# Patient Record
Sex: Female | Born: 2000 | Race: Black or African American | Hispanic: No | Marital: Single | Smoking: Never smoker
Health system: Southern US, Community
[De-identification: ages and names within clinical notes are randomized; demographics above are authoritative.]

## PROBLEM LIST (undated history)

## (undated) DIAGNOSIS — K219 Gastro-esophageal reflux disease without esophagitis: Secondary | ICD-10-CM

## (undated) HISTORY — PX: FRACTURE SURGERY: SHX138

## (undated) HISTORY — PX: APPENDECTOMY: SHX54

---
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
## 2020-01-18 ENCOUNTER — Encounter (HOSPITAL_COMMUNITY): Payer: Self-pay

## 2020-01-18 ENCOUNTER — Other Ambulatory Visit: Payer: Self-pay

## 2020-01-18 ENCOUNTER — Ambulatory Visit (HOSPITAL_COMMUNITY)
Admission: EM | Admit: 2020-01-18 | Discharge: 2020-01-18 | Disposition: A | Discharge status: Home / Self Care | Payer: Medicaid Other | Attending: Emergency Medicine | Admitting: Emergency Medicine

## 2020-01-18 DIAGNOSIS — K219 Gastro-esophageal reflux disease without esophagitis: Secondary | ICD-10-CM | POA: Diagnosis present

## 2020-01-18 DIAGNOSIS — J029 Acute pharyngitis, unspecified: Secondary | ICD-10-CM | POA: Diagnosis not present

## 2020-01-18 DIAGNOSIS — J028 Acute pharyngitis due to other specified organisms: Secondary | ICD-10-CM | POA: Diagnosis present

## 2020-01-18 LAB — POCT RAPID STREP A, ED / UC: Streptococcus, Group A Screen (Direct): NEGATIVE

## 2020-01-18 MED ORDER — OMEPRAZOLE 20 MG PO CPDR: 20.0000 mg | DELAYED_RELEASE_CAPSULE | Freq: Two times a day (BID) | ORAL | 0 refills | Status: DC

## 2020-01-18 NOTE — ED Provider Notes (Signed)
[facility]    CSN: [id number] Arrival date & time: 01/18/20  8299      History   Chief Complaint Chief Complaint  Patient presents with  . Gastroesophageal Reflux  . Sore Throat    HPI Carmen Jackson is a 19 y.o. female.   19 yr old AA femalepresents to UC with Cc of sore throat and acid reflux, hx of acid reflux, no meds tried, "eat whatever I want".   The history is provided by the patient. No language interpreter was used.  Sore Throat This is a new problem. The current episode started yesterday. The problem occurs constantly. The problem has not changed since onset.Pertinent negatives include no chest pain, no abdominal pain, no headaches and no shortness of breath. Nothing aggravates the symptoms. Nothing relieves the symptoms. She has tried nothing for the symptoms.    History reviewed. No pertinent past medical history.  Patient Active Problem List   Diagnosis Date Noted  . Pharyngitis 01/18/2020  . Gastroesophageal reflux disease 01/18/2020    Past Surgical History:  Procedure Laterality Date  . ANTERIOR CRUCIATE LIGAMENT REPAIR Left 2018  . APPENDECTOMY    . FRACTURE SURGERY      OB History   No obstetric history on file.      Home Medications    Prior to Admission medications   Medication Sig Start Date End Date Taking? Authorizing Provider  fluconazole (DIFLUCAN) 150 MG tablet One tablet now. Repeat in 3 days if still symptomatic 04/05/19   Elson Areas, PA-C  MICROGESTIN 1-20 MG-MCG tablet Take 1 tablet by mouth daily. 09/23/18   [provider]  Multiple Vitamins-Minerals (ADULT GUMMY PO) Take 1 tablet by mouth daily.    [provider]  omeprazole (PRILOSEC) 20 MG capsule Take 1 capsule (20 mg total) by mouth 2 (two) times daily before a meal for 7 days. 01/18/20 01/25/20  Kamesha Herne, Para March, NP  phenazopyridine (PYRIDIUM) 200 MG tablet Take 1 tablet (200 mg total) by mouth 3 (three) times daily as needed for pain.  12/03/18   Garlon Hatchet, PA-C    Family History Family History  Problem Relation Age of Onset  . Healthy Mother     Social History Social History   Tobacco Use  . Smoking status: Never Smoker  . Smokeless tobacco: Never Used  Vaping Use  . Vaping Use: Never used  Substance Use Topics  . Alcohol use: Not Currently  . Drug use: Never     Allergies   Patient has no known allergies.   Review of Systems Review of Systems  Constitutional: Negative for chills and fever.  HENT: Positive for congestion and sore throat. Negative for ear pain and rhinorrhea.   Eyes: Negative.   Respiratory: Negative for cough and shortness of breath.   Cardiovascular: Negative for chest pain.  Gastrointestinal: Negative for abdominal pain, nausea and vomiting.  Endocrine: Negative.   Genitourinary: Negative for dysuria.  Musculoskeletal: Negative for myalgias.  Skin: Negative for rash.  Allergic/Immunologic: Negative.   Neurological: Negative for headaches.  Hematological: Negative.   Psychiatric/Behavioral: Negative.   All other systems reviewed and are negative.    Physical Exam Triage Vital Signs ED Triage Vitals  Enc Vitals Group     BP 01/18/20 0820 134/71     Pulse Rate 01/18/20 0820 95     Resp 01/18/20 0820 18     Temp 01/18/20 0820 99.5 F (37.5 C)     Temp Source 01/18/20 0820 Oral  SpO2 01/18/20 0820 98 %     Weight --      Height --      Head Circumference --      Peak Flow --      Pain Score 01/18/20 0819 10     Pain Loc --      Pain Edu? --      Excl. in GC? --    No data found.  Updated Vital Signs BP 134/71   Pulse 95   Temp 99.5 F (37.5 C) (Oral)   Resp 18   LMP 12/30/2019   SpO2 98%   Visual Acuity Right Eye Distance:   Left Eye Distance:   Bilateral Distance:    Right Eye Near:   Left Eye Near:    Bilateral Near:     Physical Exam Vitals and nursing note reviewed.  Constitutional:      General: She is not in acute distress.     Appearance: She is well-developed.  HENT:     Head: Normocephalic.     Mouth/Throat:     Pharynx: Posterior oropharyngeal erythema present. No uvula swelling.  Eyes:     General: Lids are normal.     Conjunctiva/sclera: Conjunctivae normal.     Pupils: Pupils are equal, round, and reactive to light.  Neck:     Trachea: No tracheal deviation.  Cardiovascular:     Rate and Rhythm: Normal rate and regular rhythm.     Pulses: Normal pulses.     Heart sounds: Normal heart sounds. No murmur heard.   Pulmonary:     Effort: Pulmonary effort is normal.     Breath sounds: Normal breath sounds.  Abdominal:     General: Abdomen is flat. Bowel sounds are normal.     Palpations: Abdomen is soft.     Tenderness: There is no abdominal tenderness.  Musculoskeletal:        General: Normal range of motion.     Cervical back: Normal range of motion.  Lymphadenopathy:     Cervical: No cervical adenopathy.  Skin:    General: Skin is warm and dry.     Findings: No rash.  Neurological:     Mental Status: She is alert and oriented to person, place, and time.     GCS: GCS eye subscore is 4. GCS verbal subscore is 5. GCS motor subscore is 6.  Psychiatric:        Attention and Perception: Attention normal.        Mood and Affect: Mood normal.        Speech: Speech normal.        Behavior: Behavior normal. Behavior is cooperative.      UC Treatments / Results  Labs (all labs ordered are listed, but only abnormal results are displayed) Labs Reviewed  CULTURE, GROUP A STREP [facility])  POCT RAPID STREP A, ED / UC    EKG   Radiology No results found.  Procedures Procedures (including critical care time)  Medications Ordered in UC Medications - No data to display  Initial Impression / Assessment and Plan / UC Course  I have reviewed the triage vital signs and the nursing notes.  Pertinent labs & imaging results that were available during my care of the patient were reviewed by me and  considered in my medical decision making (see chart for details).   DDX: strep, viral illness, GERD  Final Clinical Impressions(s) / UC Diagnoses   Final diagnoses:  Pharyngitis due to  other organism  Gastroesophageal reflux disease, unspecified whether esophagitis present     Discharge Instructions     Rest.push fluids. May alternate tylenol, ibuprofen as label directed. Your strep test was negative. Please avoid spicy greasy fried foods, alcohol, smoking ,etc as it makes symptoms worse. May use OTC chloraseptic throat lozenges as directed.     ED Prescriptions    Medication Sig Dispense Auth. Provider   omeprazole (PRILOSEC) 20 MG capsule Take 1 capsule (20 mg total) by mouth 2 (two) times daily before a meal for 7 days. 14 capsule Lelah Rennaker, Para March, NP     PDMP not reviewed this encounter.   Clancy Gourd, NP 01/18/20 1004

## 2020-01-18 NOTE — Discharge Instructions (Signed)
Rest.push fluids. May alternate tylenol, ibuprofen as label directed. Your strep test was negative. Please avoid spicy greasy fried foods, alcohol, smoking ,etc as it makes symptoms worse. May use OTC chloraseptic throat lozenges as directed.

## 2020-01-18 NOTE — ED Triage Notes (Signed)
Patient woke up Monday morning with a sore throat. She thinks it may be reflux related. She has not taken any medications to treat her sore throat.

## 2020-01-19 ENCOUNTER — Telehealth (HOSPITAL_COMMUNITY): Payer: Self-pay | Admitting: Emergency Medicine

## 2020-01-19 LAB — CULTURE, GROUP A STREP (THRC): Special Requests: NORMAL

## 2020-01-19 MED ORDER — PENICILLIN V POTASSIUM 500 MG PO TABS: 500.0000 mg | ORAL_TABLET | Freq: Two times a day (BID) | ORAL | 0 refills | Status: AC

## 2020-05-03 ENCOUNTER — Encounter (HOSPITAL_BASED_OUTPATIENT_CLINIC_OR_DEPARTMENT_OTHER): Payer: Self-pay | Admitting: Orthopaedic Surgery

## 2020-05-03 ENCOUNTER — Other Ambulatory Visit: Payer: Self-pay

## 2020-05-05 ENCOUNTER — Encounter (HOSPITAL_BASED_OUTPATIENT_CLINIC_OR_DEPARTMENT_OTHER)
Admission: RE | Admit: 2020-05-05 | Discharge: 2020-05-05 | Disposition: A | Discharge status: Home / Self Care | Payer: Medicaid Other | Source: Ambulatory Visit | Attending: Orthopaedic Surgery | Admitting: Orthopaedic Surgery

## 2020-05-05 DIAGNOSIS — Z01812 Encounter for preprocedural laboratory examination: Secondary | ICD-10-CM | POA: Insufficient documentation

## 2020-05-05 LAB — POCT PREGNANCY, URINE: Preg Test, Ur: NEGATIVE

## 2020-05-05 NOTE — Progress Notes (Signed)

## 2020-05-08 ENCOUNTER — Other Ambulatory Visit (HOSPITAL_COMMUNITY)
Admission: RE | Admit: 2020-05-08 | Discharge: 2020-05-08 | Disposition: A | Discharge status: Home / Self Care | Payer: Medicaid Other | Source: Ambulatory Visit | Attending: Orthopaedic Surgery | Admitting: Orthopaedic Surgery

## 2020-05-08 DIAGNOSIS — U071 COVID-19: Secondary | ICD-10-CM | POA: Insufficient documentation

## 2020-05-08 DIAGNOSIS — Z01812 Encounter for preprocedural laboratory examination: Secondary | ICD-10-CM | POA: Insufficient documentation

## 2020-05-09 ENCOUNTER — Telehealth (HOSPITAL_COMMUNITY): Payer: Self-pay

## 2020-05-09 LAB — SARS CORONAVIRUS 2 (TAT 6-24 HRS): SARS Coronavirus 2: POSITIVE — AB

## 2020-05-09 NOTE — Progress Notes (Signed)
Notified Sherry at Dr. Austin Miles office of patient's positive covid test.

## 2020-05-09 NOTE — Telephone Encounter (Signed)
Contacted Dr. Osa Craver, Surgeon - Left detailed message on voicemail advising to contact office regarding patient's COVID19 lab results.   Contacted surgeon's office - spoke to Ashley/Scheduler - advised patient's COVID19 lab results were positive; surgery will need to be pushed out 10 days unless surgeon deems surgery is urgent/emergent. Morrie Sheldon confirmed she would send surgeon a message regarding results. MBM,CMA

## 2020-05-10 ENCOUNTER — Telehealth: Payer: Self-pay

## 2020-05-10 NOTE — Telephone Encounter (Signed)
Called to discuss with patient about COVID-19 symptoms and the use of one of the available treatments for those with mild to moderate Covid symptoms and at a high risk of hospitalization.  Pt appears to qualify for outpatient treatment due to co-morbid conditions and/or a member of an at-risk group in accordance with the FDA Emergency Use Authorization.    Symptom onset: No symptoms Vaccinated: No Booster? No Immunocompromised? No Qualifiers: None  Pt. Has no symptoms.  Carmen Jackson

## 2020-05-17 ENCOUNTER — Ambulatory Visit (HOSPITAL_COMMUNITY)
Admission: EM | Admit: 2020-05-17 | Discharge: 2020-05-17 | Disposition: A | Discharge status: Home / Self Care | Payer: Medicaid Other | Attending: Medical Oncology | Admitting: Medical Oncology

## 2020-05-17 ENCOUNTER — Encounter (HOSPITAL_COMMUNITY): Payer: Self-pay | Admitting: Emergency Medicine

## 2020-05-17 ENCOUNTER — Other Ambulatory Visit: Payer: Self-pay

## 2020-05-17 DIAGNOSIS — N3001 Acute cystitis with hematuria: Secondary | ICD-10-CM | POA: Diagnosis not present

## 2020-05-17 LAB — POCT URINALYSIS DIPSTICK, ED / UC
Bilirubin Urine: NEGATIVE
Glucose, UA: 100 mg/dL — AB
Ketones, ur: NEGATIVE mg/dL
Nitrite: POSITIVE — AB
Protein, ur: 100 mg/dL — AB
Specific Gravity, Urine: 1.025 (ref 1.005–1.030)
Urobilinogen, UA: 1 mg/dL (ref 0.0–1.0)
pH: 6 (ref 5.0–8.0)

## 2020-05-17 LAB — POC URINE PREG, ED: Preg Test, Ur: NEGATIVE

## 2020-05-17 MED ORDER — SULFAMETHOXAZOLE-TRIMETHOPRIM 800-160 MG PO TABS: 1.0000 | ORAL_TABLET | Freq: Two times a day (BID) | ORAL | 0 refills | Status: AC

## 2020-05-17 NOTE — ED Provider Notes (Signed)
[facility]    CSN: [id number] Arrival date & time: 05/17/20  1954      History   Chief Complaint Chief Complaint  Patient presents with  . Dysuria  . Urinary Urgency  . Abdominal Pain    HPI Carmen Jackson is a 20 y.o. female.   HPI   Dysuria: Patient reports about for the past 3 days she has had dysuria, urinary urgency and suprapubic heaviness.  Similar to past UTIs.  She has tried cranberry supplement as well as Azo for symptoms with mild relief.  She denies any fevers, vomiting, significant abdominal pain.  Her last menstrual cycle was on the 11th of this month. No UTIs within 3 months.   History reviewed. No pertinent past medical history.  Patient Active Problem List   Diagnosis Date Noted  . Pharyngitis 01/18/2020  . Gastroesophageal reflux disease 01/18/2020    Past Surgical History:  Procedure Laterality Date  . ANTERIOR CRUCIATE LIGAMENT REPAIR Left 2018  . APPENDECTOMY    . FRACTURE SURGERY      OB History   No obstetric history on file.      Home Medications    Prior to Admission medications   Medication Sig Start Date End Date Taking? Authorizing Provider  sulfamethoxazole-trimethoprim (BACTRIM DS) 800-160 MG tablet Take 1 tablet by mouth 2 (two) times daily for 3 days. 05/17/20 05/20/20 Yes Rushie Chestnut, PA-C    Family History Family History  Problem Relation Age of Onset  . Healthy Mother     Social History Social History   Tobacco Use  . Smoking status: Never Smoker  . Smokeless tobacco: Never Used  Vaping Use  . Vaping Use: Never used  Substance Use Topics  . Alcohol use: Not Currently  . Drug use: Never     Allergies   Patient has no known allergies.   Review of Systems Review of Systems  As stated above in HPI Physical Exam Triage Vital Signs ED Triage Vitals  Enc Vitals Group     BP 05/17/20 2005 115/74     Pulse Rate 05/17/20 2005 83     Resp 05/17/20 2005 18     Temp 05/17/20 2005 98 F  (36.7 C)     Temp Source 05/17/20 2005 Oral     SpO2 05/17/20 2005 99 %     Weight --      Height --      Head Circumference --      Peak Flow --      Pain Score 05/17/20 2003 5     Pain Loc --      Pain Edu? --      Excl. in GC? --    No data found.  Updated Vital Signs BP 115/74 (BP Location: Left Arm)   Pulse 83   Temp 98 F (36.7 C) (Oral)   Resp 18   LMP 05/08/2020 (Approximate)   SpO2 99%   Physical Exam Vitals and nursing note reviewed.  Constitutional:      General: She is not in acute distress.    Appearance: She is not ill-appearing, toxic-appearing or diaphoretic.  Cardiovascular:     Rate and Rhythm: Normal rate and regular rhythm.     Heart sounds: Normal heart sounds.  Pulmonary:     Effort: Pulmonary effort is normal.     Breath sounds: Normal breath sounds.  Abdominal:     General: Abdomen is flat. Bowel sounds are normal.  Palpations: Abdomen is soft. There is no shifting dullness, fluid wave, hepatomegaly, splenomegaly, mass or pulsatile mass.     Tenderness: There is no abdominal tenderness. There is no right CVA tenderness, left CVA tenderness, guarding or rebound. Negative signs include Murphy's sign, Rovsing's sign, McBurney's sign, psoas sign and obturator sign.     Hernia: No hernia is present.  Neurological:     Mental Status: She is oriented to person, place, and time.      UC Treatments / Results  Labs (all labs ordered are listed, but only abnormal results are displayed) Labs Reviewed  POCT URINALYSIS DIPSTICK, ED / UC - Abnormal; Notable for the following components:      Result Value   Glucose, UA 100 (*)    Hgb urine dipstick LARGE (*)    Protein, ur 100 (*)    Nitrite POSITIVE (*)    Leukocytes,Ua LARGE (*)    All other components within normal limits  URINE CULTURE  POC URINE PREG, ED    EKG   Radiology No results found.  Procedures Procedures (including critical care time)  Medications Ordered in  UC Medications - No data to display  Initial Impression / Assessment and Plan / UC Course  I have reviewed the triage vital signs and the nursing notes.  Pertinent labs & imaging results that were available during my care of the patient were reviewed by me and considered in my medical decision making (see chart for details).     New.  Acute cystitis with hematuria. Culture pending to confirm. Treating with Bactrim to prevent systemic illness.  Discussed how to take the medication along with common potential side effects and precautions.  Discussed red flag symptoms.  She will stay hydrated with water and she can stop the Azo supplements now.  Follow-up as needed. Final Clinical Impressions(s) / UC Diagnoses   Final diagnoses:  Acute cystitis with hematuria   Discharge Instructions   None    ED Prescriptions    Medication Sig Dispense Auth. Provider   sulfamethoxazole-trimethoprim (BACTRIM DS) 800-160 MG tablet Take 1 tablet by mouth 2 (two) times daily for 3 days. 6 tablet Rushie Chestnut, [place]     PDMP not reviewed this encounter.   Rushie Chestnut, [place] 05/17/20 2023

## 2020-05-17 NOTE — ED Triage Notes (Signed)
Pt states that she has dysuria, urine urgency, and abdominal pain. Pt states that she took AZO and uricalm OTC.

## 2020-05-18 ENCOUNTER — Other Ambulatory Visit: Payer: Self-pay

## 2020-05-18 ENCOUNTER — Encounter (HOSPITAL_BASED_OUTPATIENT_CLINIC_OR_DEPARTMENT_OTHER): Payer: Self-pay | Admitting: Orthopaedic Surgery

## 2020-05-19 ENCOUNTER — Telehealth (HOSPITAL_COMMUNITY): Payer: Self-pay | Admitting: Emergency Medicine

## 2020-05-19 NOTE — Telephone Encounter (Signed)
Received notification that the lab is unable to run patient's urine culture.  Reviewed with Dr. Leonides Grills, who states no need for recollection at this time.

## 2020-05-22 NOTE — H&P (Signed)
PREOPERATIVE H&P  Chief Complaint: LEFT KNEE MEDIAL MENISCUS TEAR  HPI: Carmen Jackson is a 20 y.o. female who is scheduled for, Procedure(s): KNEE ARTHROSCOPY WITH MEDIAL MENISECTOMY WITH POSSIBLE MENISCUS REPAIR ALLOGRAFT APPLICATION TIBIA AND FEMUR.   Patient is a healthy 20 year-old who had an injury in June of 2018 when she was playing basketball and track.  She had an acute traumatic injury that eventually in December of 2018 was determined to be a meniscus tear and an ACL tear.  She had what sounds like an allograft or allograft augmented autograft hamstring combination done in [place], [place], which went mostly without issue.  She recovered and had two instability type sounding events with a knee that swelled up considerably, one in November of 2020 with an MRI that demonstrated a possible medial meniscus tear, though reportedly small, and another about a month ago.  She continues to have reported instability and pain in the medial aspect of her knee.  She is unhappy with the function of her knee.  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    Her symptoms are rated as moderate to severe, and have been worsening.  This is significantly impairing activities of daily living.    Please see clinic note for further details on this patient's care.    She has elected for surgical management.   Past Medical History:  Diagnosis Date  . GERD (gastroesophageal reflux disease)    Past Surgical History:  Procedure Laterality Date  . ANTERIOR CRUCIATE LIGAMENT REPAIR Left 2018  . APPENDECTOMY    . FRACTURE SURGERY     Social History   Socioeconomic History  . Marital status: Single    Spouse name: Not on file  . Number of children: Not on file  . Years of education: Not on file  . Highest education level: Not on file  Occupational History  . Not on file  Tobacco Use  . Smoking status: Never Smoker  . Smokeless  tobacco: Never Used  Vaping Use  . Vaping Use: Never used  Substance and Sexual Activity  . Alcohol use: Not Currently  . Drug use: Never  . Sexual activity: Not on file  Other Topics Concern  . Not on file  Social History Narrative  . Not on file   Social Determinants of Health   Financial Resource Strain: Not on file  Food Insecurity: Not on file  Transportation Needs: Not on file  Physical Activity: Not on file  Stress: Not on file  Social Connections: Not on file   Family History  Problem Relation Age of Onset  . Healthy Mother    No Known Allergies Prior to Admission medications   Not on File    ROS: All other systems have been reviewed and were otherwise negative with the exception of those mentioned in the HPI and as above.  Physical Exam: General: Alert, no acute distress Cardiovascular: No pedal edema Respiratory: No cyanosis, no use of accessory musculature GI: No organomegaly, abdomen is soft and non-tender Skin: No lesions in the area of chief complaint Neurologic: Sensation intact distally Psychiatric: Patient is competent for consent with normal mood and affect Lymphatic: No axillary or cervical lymphadenopathy  MUSCULOSKELETAL:  Examination of the left knee demonstrates full range of motion with -10 to 15 degrees of hyperextension to 120 degrees of flexion with pain in the medial aspect of the knee with palpation, as well  as deep flexion.  She has a Grade IIA Lachman that appears to have an end point, but has significant translation.  She has an equivocal pivot glide.  She has significant opening to a valgus directed force across the knee it feels compared to the contralateral side, though there is an end point.  External rotation dial testing appears to be normal however.    Imaging: MRI is reviewed and demonstrates about 18 mm of tunnel widening. She has a graft which appears atypical but it is still mostly intact. She has an absorbable screw or plastic  screw in her tibia. I cannot tell exactly what her fixation is in the femur. She has a bucket handle medial meniscus tear.   Assessment: LEFT KNEE MEDIAL MENISCUS TEAR  Plan: Plan for Procedure(s): KNEE ARTHROSCOPY WITH MEDIAL MENISECTOMY WITH POSSIBLE MENISCUS REPAIR ALLOGRAFT APPLICATION TIBIA AND FEMUR  Plan: exam under anesthesia carefully and then an arthroscopic evaluation likely performing a meniscectomy versus repair.  If her ACL truly appears incompetent from a rotational standpoint then we would resect the remaining fibers, bone graft her tibial and femoral tunnel using a combination autograft and allograft using bone harvesting trephines, as well as allograft DBM putty and we would plan for a second stage 4-6 months later  The risks benefits and alternatives were discussed with the patient including but not limited to the risks of nonoperative treatment, versus surgical intervention including infection, bleeding, nerve injury,  blood clots, cardiopulmonary complications, morbidity, mortality, among others, and they were willing to proceed.   The patient acknowledged the explanation, agreed to proceed with the plan and consent was signed.   Operative Plan: Left knee scope with medial meniscectomy versus repair and bone grafting of ACL tunnels Discharge Medications: Standard DVT Prophylaxis: Aspirin Physical Therapy: Outpatient PT Special Discharge needs: Knee immobilizer   Vernetta Honey, PA-C  05/22/2020 4:32 PM

## 2020-05-25 ENCOUNTER — Encounter (HOSPITAL_BASED_OUTPATIENT_CLINIC_OR_DEPARTMENT_OTHER): Payer: Self-pay | Admitting: Orthopaedic Surgery

## 2020-05-25 ENCOUNTER — Ambulatory Visit (HOSPITAL_BASED_OUTPATIENT_CLINIC_OR_DEPARTMENT_OTHER): Payer: Medicaid Other | Admitting: Certified Registered"

## 2020-05-25 ENCOUNTER — Ambulatory Visit (HOSPITAL_BASED_OUTPATIENT_CLINIC_OR_DEPARTMENT_OTHER)
Admission: RE | Admit: 2020-05-25 | Discharge: 2020-05-25 | Disposition: A | Discharge status: Home / Self Care | Payer: Medicaid Other | Attending: Orthopaedic Surgery | Admitting: Orthopaedic Surgery

## 2020-05-25 ENCOUNTER — Ambulatory Visit (HOSPITAL_COMMUNITY): Payer: Medicaid Other

## 2020-05-25 ENCOUNTER — Other Ambulatory Visit: Payer: Self-pay

## 2020-05-25 ENCOUNTER — Encounter (HOSPITAL_BASED_OUTPATIENT_CLINIC_OR_DEPARTMENT_OTHER)
Admission: RE | Disposition: A | Discharge status: Home / Self Care | Payer: Self-pay | Source: Home / Self Care | Attending: Orthopaedic Surgery

## 2020-05-25 DIAGNOSIS — Z09 Encounter for follow-up examination after completed treatment for conditions other than malignant neoplasm: Secondary | ICD-10-CM

## 2020-05-25 DIAGNOSIS — S83512A Sprain of anterior cruciate ligament of left knee, initial encounter: Secondary | ICD-10-CM | POA: Insufficient documentation

## 2020-05-25 DIAGNOSIS — X58XXXA Exposure to other specified factors, initial encounter: Secondary | ICD-10-CM | POA: Insufficient documentation

## 2020-05-25 DIAGNOSIS — S83212A Bucket-handle tear of medial meniscus, current injury, left knee, initial encounter: Secondary | ICD-10-CM | POA: Diagnosis not present

## 2020-05-25 HISTORY — PX: ALLOGRAFT APPLICATION: SHX6404

## 2020-05-25 HISTORY — PX: KNEE ARTHROSCOPY WITH MEDIAL MENISECTOMY: SHX5651

## 2020-05-25 HISTORY — DX: Gastro-esophageal reflux disease without esophagitis: K21.9

## 2020-05-25 LAB — POCT PREGNANCY, URINE: Preg Test, Ur: NEGATIVE

## 2020-05-25 SURGERY — ARTHROSCOPY, KNEE, WITH MEDIAL MENISCECTOMY
Anesthesia: General | Site: Knee | Laterality: Left

## 2020-05-25 MED ORDER — LACTATED RINGERS IV SOLN: INTRAVENOUS | Status: DC

## 2020-05-25 MED ORDER — MIDAZOLAM HCL 2 MG/2ML IJ SOLN
INTRAMUSCULAR | Status: DC | PRN
  Administered 2020-05-25: 2 mg via INTRAVENOUS

## 2020-05-25 MED ORDER — FENTANYL CITRATE (PF) 100 MCG/2ML IJ SOLN
INTRAMUSCULAR | Status: AC
  Filled 2020-05-25: qty 2

## 2020-05-25 MED ORDER — SODIUM CHLORIDE 0.9 % IR SOLN
Status: DC | PRN
  Administered 2020-05-25: 6000 mL

## 2020-05-25 MED ORDER — ROPIVACAINE HCL 7.5 MG/ML IJ SOLN
INTRAMUSCULAR | Status: DC | PRN
  Administered 2020-05-25: 20 mL via PERINEURAL

## 2020-05-25 MED ORDER — ONDANSETRON HCL 4 MG/2ML IJ SOLN
INTRAMUSCULAR | Status: AC
  Filled 2020-05-25: qty 2

## 2020-05-25 MED ORDER — PROPOFOL 10 MG/ML IV BOLUS
INTRAVENOUS | Status: DC | PRN
  Administered 2020-05-25: 150 mg via INTRAVENOUS

## 2020-05-25 MED ORDER — VANCOMYCIN HCL 1000 MG IV SOLR
INTRAVENOUS | Status: DC | PRN
  Administered 2020-05-25: 1000 mg

## 2020-05-25 MED ORDER — ACETAMINOPHEN 500 MG PO TABS: 1000.0000 mg | ORAL_TABLET | Freq: Three times a day (TID) | ORAL | 0 refills | Status: AC

## 2020-05-25 MED ORDER — MIDAZOLAM HCL 2 MG/2ML IJ SOLN
INTRAMUSCULAR | Status: AC
  Filled 2020-05-25: qty 2

## 2020-05-25 MED ORDER — FENTANYL CITRATE (PF) 100 MCG/2ML IJ SOLN
25.0000 ug | INTRAMUSCULAR | Status: DC | PRN
  Administered 2020-05-25: 50 ug via INTRAVENOUS

## 2020-05-25 MED ORDER — OXYCODONE HCL 5 MG PO TABS
5.0000 mg | ORAL_TABLET | Freq: Once | ORAL | Status: AC
Start: 2020-05-25 — End: 2020-05-25
  Administered 2020-05-25: 5 mg via ORAL

## 2020-05-25 MED ORDER — OXYCODONE HCL 5 MG PO TABS: ORAL_TABLET | ORAL | 0 refills | Status: AC

## 2020-05-25 MED ORDER — DEXAMETHASONE SODIUM PHOSPHATE 10 MG/ML IJ SOLN
INTRAMUSCULAR | Status: AC
  Filled 2020-05-25: qty 1

## 2020-05-25 MED ORDER — ACETAMINOPHEN 500 MG PO TABS
1000.0000 mg | ORAL_TABLET | Freq: Once | ORAL | Status: AC
  Administered 2020-05-25: 1000 mg via ORAL

## 2020-05-25 MED ORDER — PROPOFOL 10 MG/ML IV BOLUS
INTRAVENOUS | Status: AC
  Filled 2020-05-25: qty 40

## 2020-05-25 MED ORDER — DEXAMETHASONE SODIUM PHOSPHATE 10 MG/ML IJ SOLN
INTRAMUSCULAR | Status: DC | PRN
  Administered 2020-05-25: 10 mg

## 2020-05-25 MED ORDER — CEFAZOLIN SODIUM-DEXTROSE 2-4 GM/100ML-% IV SOLN: 2.0000 g | INTRAVENOUS | Status: DC

## 2020-05-25 MED ORDER — FENTANYL CITRATE (PF) 100 MCG/2ML IJ SOLN: 100.0000 ug | Freq: Once | INTRAMUSCULAR | Status: DC

## 2020-05-25 MED ORDER — PROMETHAZINE HCL 25 MG/ML IJ SOLN: 6.2500 mg | INTRAMUSCULAR | Status: DC | PRN

## 2020-05-25 MED ORDER — MELOXICAM 7.5 MG PO TABS: 7.5000 mg | ORAL_TABLET | Freq: Every day | ORAL | 0 refills | Status: AC

## 2020-05-25 MED ORDER — LIDOCAINE HCL (CARDIAC) PF 100 MG/5ML IV SOSY
PREFILLED_SYRINGE | INTRAVENOUS | Status: DC | PRN
  Administered 2020-05-25: 60 mg via INTRAVENOUS

## 2020-05-25 MED ORDER — ACETAMINOPHEN 500 MG PO TABS
ORAL_TABLET | ORAL | Status: AC
  Filled 2020-05-25: qty 2

## 2020-05-25 MED ORDER — SODIUM CHLORIDE 0.9 % IR SOLN
Status: DC | PRN
  Administered 2020-05-25: 5000 mL

## 2020-05-25 MED ORDER — DEXAMETHASONE SODIUM PHOSPHATE 10 MG/ML IJ SOLN
INTRAMUSCULAR | Status: DC | PRN
  Administered 2020-05-25: 10 mg via INTRAVENOUS

## 2020-05-25 MED ORDER — LIDOCAINE 2% (20 MG/ML) 5 ML SYRINGE
INTRAMUSCULAR | Status: AC
  Filled 2020-05-25: qty 5

## 2020-05-25 MED ORDER — KETOROLAC TROMETHAMINE 30 MG/ML IJ SOLN
INTRAMUSCULAR | Status: DC | PRN
  Administered 2020-05-25: 30 mg via INTRAVENOUS

## 2020-05-25 MED ORDER — CEFAZOLIN SODIUM-DEXTROSE 2-3 GM-%(50ML) IV SOLR
INTRAVENOUS | Status: DC | PRN
  Administered 2020-05-25: 2 g via INTRAVENOUS

## 2020-05-25 MED ORDER — OXYCODONE HCL 5 MG PO TABS
ORAL_TABLET | ORAL | Status: AC
  Filled 2020-05-25: qty 1

## 2020-05-25 MED ORDER — ASPIRIN 81 MG PO CHEW: 81.0000 mg | CHEWABLE_TABLET | Freq: Two times a day (BID) | ORAL | 0 refills | Status: AC

## 2020-05-25 MED ORDER — FENTANYL CITRATE (PF) 100 MCG/2ML IJ SOLN
INTRAMUSCULAR | Status: DC | PRN
  Administered 2020-05-25 (×2): 50 ug via INTRAVENOUS

## 2020-05-25 MED ORDER — ONDANSETRON HCL 4 MG PO TABS: 4.0000 mg | ORAL_TABLET | Freq: Three times a day (TID) | ORAL | 0 refills | Status: AC | PRN

## 2020-05-25 MED ORDER — ONDANSETRON HCL 4 MG/2ML IJ SOLN
INTRAMUSCULAR | Status: DC | PRN
  Administered 2020-05-25: 4 mg via INTRAVENOUS

## 2020-05-25 MED ORDER — MIDAZOLAM HCL 2 MG/2ML IJ SOLN
2.0000 mg | Freq: Once | INTRAMUSCULAR | Status: AC
  Administered 2020-05-25: 2 mg via INTRAVENOUS

## 2020-05-25 MED ORDER — EPINEPHRINE PF 1 MG/ML IJ SOLN
INTRAMUSCULAR | Status: AC
  Filled 2020-05-25: qty 2

## 2020-05-25 MED ORDER — VANCOMYCIN HCL 1000 MG IV SOLR
INTRAVENOUS | Status: AC
  Filled 2020-05-25: qty 2000

## 2020-05-25 MED ORDER — LACTATED RINGERS IV SOLN: INTRAVENOUS | Status: DC | PRN

## 2020-05-25 MED ORDER — CEFAZOLIN SODIUM-DEXTROSE 2-4 GM/100ML-% IV SOLN
INTRAVENOUS | Status: AC
  Filled 2020-05-25: qty 100

## 2020-05-25 MED ORDER — FENTANYL CITRATE (PF) 100 MCG/2ML IJ SOLN
100.0000 ug | Freq: Once | INTRAMUSCULAR | Status: AC
  Administered 2020-05-25: 100 ug via INTRAVENOUS

## 2020-05-25 SURGICAL SUPPLY — 81 items
APL PRP STRL LF DISP 70% ISPRP (MISCELLANEOUS) ×1
BLADE AVERAGE 25MMX9MM (BLADE)
BLADE AVERAGE 25X9 (BLADE) IMPLANT
BLADE SHAVER BONE 5.0MM X 13CM (MISCELLANEOUS) ×1
BLADE SHAVER BONE 5.0X13 (MISCELLANEOUS) ×2 IMPLANT
BLADE SURG 10 STRL SS (BLADE) ×3 IMPLANT
BLADE SURG 15 STRL LF DISP TIS (BLADE) ×1 IMPLANT
BLADE SURG 15 STRL SS (BLADE) ×3
BNDG ELASTIC 6X5.8 VLCR STR LF (GAUZE/BANDAGES/DRESSINGS) ×3 IMPLANT
BONE TUNNEL PLUG CANNULATED (MISCELLANEOUS) IMPLANT
BURR OVAL 8 FLU 4.0MM X 13CM (MISCELLANEOUS)
BURR OVAL 8 FLU 4.0X13 (MISCELLANEOUS) IMPLANT
CHLORAPREP W/TINT 26 (MISCELLANEOUS) ×3 IMPLANT
CLOSURE STERI-STRIP 1/2X4 (GAUZE/BANDAGES/DRESSINGS) ×1
CLSR STERI-STRIP ANTIMIC 1/2X4 (GAUZE/BANDAGES/DRESSINGS) ×2 IMPLANT
COOLER ICEMAN CLASSIC (MISCELLANEOUS) ×3 IMPLANT
COVER BACK TABLE 60X90IN (DRAPES) ×3 IMPLANT
COVER WAND RF STERILE (DRAPES) IMPLANT
CUFF TOURN SGL QUICK 34 (TOURNIQUET CUFF) ×3
CUFF TRNQT CYL 34X4.125X (TOURNIQUET CUFF) ×1 IMPLANT
DECANTER SPIKE VIAL GLASS SM (MISCELLANEOUS) IMPLANT
DISSECTOR 3.5MM X 13CM CVD (MISCELLANEOUS) IMPLANT
DISSECTOR 4.0MMX13CM CVD (MISCELLANEOUS) ×3 IMPLANT
DRAPE ARTHROSCOPY W/POUCH 90 (DRAPES) ×3 IMPLANT
DRAPE IMP U-DRAPE 54X76 (DRAPES) ×3 IMPLANT
DRAPE TOP ARMCOVERS (MISCELLANEOUS) ×3 IMPLANT
DRAPE U-SHAPE 47X51 STRL (DRAPES) ×3 IMPLANT
ELECT REM PT RETURN 9FT ADLT (ELECTROSURGICAL) ×3
ELECTRODE REM PT RTRN 9FT ADLT (ELECTROSURGICAL) ×1 IMPLANT
GAUZE SPONGE 4X4 12PLY STRL (GAUZE/BANDAGES/DRESSINGS) ×3 IMPLANT
GLOVE SRG 8 PF TXTR STRL LF DI (GLOVE) ×1 IMPLANT
GLOVE SURG ENC MOIS LTX SZ6.5 (GLOVE) ×6 IMPLANT
GLOVE SURG LTX SZ8 (GLOVE) ×3 IMPLANT
GLOVE SURG UNDER POLY LF SZ6.5 (GLOVE) ×3 IMPLANT
GLOVE SURG UNDER POLY LF SZ7 (GLOVE) ×3 IMPLANT
GLOVE SURG UNDER POLY LF SZ8 (GLOVE) ×3
GOWN STRL REUS W/ TWL LRG LVL3 (GOWN DISPOSABLE) ×2 IMPLANT
GOWN STRL REUS W/TWL LRG LVL3 (GOWN DISPOSABLE) ×6
GOWN STRL REUS W/TWL XL LVL3 (GOWN DISPOSABLE) ×3 IMPLANT
GUIDEPIN FLEX PATHFINDER 2.4MM (WIRE) IMPLANT
HARVESTER BONE GRAFT 6 (INSTRUMENTS) ×3 IMPLANT
IMMOBILIZER KNEE 22 UNIV (SOFTGOODS) IMPLANT
IMMOBILIZER KNEE 24 THIGH 36 (MISCELLANEOUS) IMPLANT
IMMOBILIZER KNEE 24 UNIV (MISCELLANEOUS)
IV NS IRRIG 3000ML ARTHROMATIC (IV SOLUTION) ×12 IMPLANT
KIT TRANSTIBIAL (DISPOSABLE) ×3 IMPLANT
KNIFE GRAFT ACL 10MM 5952 (MISCELLANEOUS) IMPLANT
KNIFE GRAFT ACL 9MM (MISCELLANEOUS) IMPLANT
MANIFOLD NEPTUNE II (INSTRUMENTS) ×3 IMPLANT
NDL SAFETY ECLIPSE 18X1.5 (NEEDLE) ×1 IMPLANT
NEEDLE HYPO 18GX1.5 SHARP (NEEDLE) ×3
NEEDLE SPNL 18GX3.5 QUINCKE PK (NEEDLE) ×3 IMPLANT
NS IRRIG 1000ML POUR BTL (IV SOLUTION) ×3 IMPLANT
PACK ARTHROSCOPY DSU (CUSTOM PROCEDURE TRAY) ×3 IMPLANT
PACK BASIN DAY SURGERY FS (CUSTOM PROCEDURE TRAY) ×3 IMPLANT
PAD COLD SHLDR UNI XL WRAP-ON (PAD) ×3
PAD COLD SHLDR WRAP-ON (PAD) IMPLANT
PAD COLD UNI XL WRAP-ON (PAD) ×1 IMPLANT
PENCIL SMOKE EVACUATOR (MISCELLANEOUS) ×3 IMPLANT
PORT APPOLLO RF 90DEGREE MULTI (SURGICAL WAND) ×3 IMPLANT
PUTTY DBM ALLOSYNC PURE 5CC (Putty) ×3 IMPLANT
PUTTY DBM STAGRAFT PLUS 5CC (Putty) ×3 IMPLANT
SLEEVE SCD COMPRESS KNEE MED (STOCKING) ×3 IMPLANT
SPONGE LAP 4X18 RFD (DISPOSABLE) ×3 IMPLANT
SUT FIBERWIRE #2 38 T-5 BLUE (SUTURE)
SUT MNCRL AB 4-0 PS2 18 (SUTURE) ×3 IMPLANT
SUT VIC AB 0 CT1 27 (SUTURE) ×3
SUT VIC AB 0 CT1 27XBRD ANBCTR (SUTURE) ×1 IMPLANT
SUT VIC AB 3-0 SH 27 (SUTURE) ×3
SUT VIC AB 3-0 SH 27X BRD (SUTURE) ×1 IMPLANT
SUTURE FIBERWR #2 38 T-5 BLUE (SUTURE) IMPLANT
SYR 10ML LL (SYRINGE) ×3 IMPLANT
SYR 20CC LL (SYRINGE) ×3 IMPLANT
SYR 3ML 23GX1 SAFETY (SYRINGE) ×3 IMPLANT
SYR 5ML LL (SYRINGE) ×3 IMPLANT
TOWEL GREEN STERILE FF (TOWEL DISPOSABLE) ×9 IMPLANT
TUBE CONNECTING 20'X1/4 (TUBING) ×1
TUBE CONNECTING 20X1/4 (TUBING) ×2 IMPLANT
TUBE SUCTION HIGH CAP CLEAR NV (SUCTIONS) ×3 IMPLANT
TUBING ARTHROSCOPY IRRIG 16FT (MISCELLANEOUS) ×3 IMPLANT
WRAP KNEE MAXI GEL POST OP (GAUZE/BANDAGES/DRESSINGS) IMPLANT

## 2020-05-25 NOTE — Interval H&P Note (Signed)
History and Physical Interval Note:  05/25/2020 7:25 AM  Carmen Jackson  has presented today for surgery, with the diagnosis of LEFT KNEE MEDIAL MENISCUS TEAR.  The various methods of treatment have been discussed with the patient and family. After consideration of risks, benefits and other options for treatment, the patient has consented to  Procedure(s): KNEE ARTHROSCOPY WITH MEDIAL MENISECTOMY WITH POSSIBLE MENISCUS REPAIR (Left) ALLOGRAFT APPLICATION TIBIA AND FEMUR (N/A) as a surgical intervention.  The patient's history has been reviewed, patient examined, no change in status, stable for surgery.  I have reviewed the patient's chart and labs.  Questions were answered to the patient's satisfaction.     Bjorn Pippin
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
## 2020-05-25 NOTE — Discharge Instructions (Signed)
Post Anesthesia Home Care Instructions  Activity: Get plenty of rest for the remainder of the day. A responsible individual must stay with you for 24 hours following the procedure.  For the next 24 hours, DO NOT: -Drive a car -Advertising copywriter -Drink alcoholic beverages -Take any medication unless instructed by your physician -Make any legal decisions or sign important papers.  Meals: Start with liquid foods such as gelatin or soup. Progress to regular foods as tolerated. Avoid greasy, spicy, heavy foods. If nausea and/or vomiting occur, drink only clear liquids until the nausea and/or vomiting subsides. Call your physician if vomiting continues.  Special Instructions/Symptoms: Your throat may feel dry or sore from the anesthesia or the breathing tube placed in your throat during surgery. If this causes discomfort, gargle with warm salt water. The discomfort should disappear within 24 hours.  If you had a scopolamine patch placed behind your ear for the management of post- operative nausea and/or vomiting:  1. The medication in the patch is effective for 72 hours, after which it should be removed.  Wrap patch in a tissue and discard in the trash. Wash hands thoroughly with soap and water. 2. You may remove the patch earlier than 72 hours if you experience unpleasant side effects which may include dry mouth, dizziness or visual disturbances. 3. Avoid touching the patch. Wash your hands with soap and water after contact with the patch.       Regional Anesthesia Blocks  1. Numbness or the inability to move the "blocked" extremity may last from 3-48 hours after placement. The length of time depends on the medication injected and your individual response to the medication. If the numbness is not going away after 48 hours, call your surgeon.  2. The extremity that is blocked will need to be protected until the numbness is gone and the  Strength has returned. Because you cannot feel it, you  will need to take extra care to avoid injury. Because it may be weak, you may have difficulty moving it or using it. You may not know what position it is in without looking at it while the block is in effect.  3. For blocks in the legs and feet, returning to weight bearing and walking needs to be done carefully. You will need to wait until the numbness is entirely gone and the strength has returned. You should be able to move your leg and foot normally before you try and bear weight or walk. You will need someone to be with you when you first try to ensure you do not fall and possibly risk injury.  4. Bruising and tenderness at the needle site are common side effects and will resolve in a few days.  5. Persistent numbness or new problems with movement should be communicated to the surgeon or the [facility] [phone] [facility] [phone]).   Ramond Marrow MD, MPH Alfonse Alpers, PA-C [facility] [address], [address] [phone] (tel)   [phone] (fax)   POST-OPERATIVE INSTRUCTIONS - Knee Arthroscopy  WOUND CARE - You may remove the Operative Dressing on Post-Op Day #3 (72hrs after surgery).   -  Alternatively if you would like you can leave dressing on until follow-up if within 7-8 days but keep it dry. - Leave steri-strips in place until they fall off on their own, usually 2 weeks postop. - An ACE wrap may be used to control swelling, do not wrap this too tight.  If  the initial ACE wrap feels too tight you may loosen it. - There may be a small amount of fluid/bleeding leaking at the surgical site.  - This is normal; the knee is filled with fluid during the procedure and can leak for 24-48hrs after surgery. You may change/reinforce the bandage as needed.  - Use the Cryocuff or Ice as often as possible for the first 7 days, then as needed for pain relief. Always keep a towel, ACE wrap or other barrier between the cooling unit  and your skin.  - You may shower on Post-Op Day #3. Gently pat the area dry. Do not soak the knee in water or submerge it.  - Do not go swimming in the pool or ocean until 4 weeks after surgery or when otherwise instructed.  Keep dry incisions as dry as possible.   BRACE/AMBULATION -  You will not need a brace after this procedure - You can put full weight on your operative leg as you feel comfortable - You may use crutches or a walker initially to help you ambulate, but this is not required  REGIONAL ANESTHESIA (NERVE BLOCKS) - The anesthesia team may have performed a nerve block for you if safe in the setting of your care.  This is a great tool used to minimize pain.  Typically the block may start wearing off overnight.  This can be a challenging period but please utilize your as needed pain medications to try and manage this period and know it will be a brief transition as the nerve block wears completely   POST-OP MEDICATIONS - Multimodal approach to pain control - In general your pain will be controlled with a combination of substances.  Prescriptions unless otherwise discussed are electronically sent to your pharmacy.  This is a carefully made plan we use to minimize narcotic use.     - Meloxicam - Anti-inflammatory medication taken on a scheduled basis   - Take 1 tablet once a day - Acetaminophen - Non-narcotic pain medicine taken on a scheduled basis    - Take two 500 mg tablets (1,000 mg total) every 8 hours - Oxycodone - This is a strong narcotic, to be used only on an "as needed" basis for pain. - Aspirin 81mg  - This medicine is used to minimize the risk of blood clots after surgery.   - Take 1 tablet twice a day for 6 weeks -  Zofran - take as needed for nausea   FOLLOW-UP   Please call the office to schedule a follow-up appointment for your incision check, 7-10 days post-operatively.   IF YOU HAVE ANY QUESTIONS, PLEASE FEEL FREE TO CALL OUR OFFICE.   HELPFUL  INFORMATION  - If you had a block, it will wear off between 8-24 hrs postop typically.  This is period when your pain may go from nearly zero to the pain you would have had post-op without the block.  This is an abrupt transition but nothing dangerous is happening.  You may take an extra dose of narcotic when this happens.   Keep your leg elevated to decrease swelling, which will then in turn decrease your pain. I would elevate the foot of your bed by putting a couple of couch pillows between your mattress and box spring. I would not keep pillow directly under your ankle.  - Do not sleep with a pillow behind your knee even if it is more comfortable as this may make it harder to get your knee fully straight  long term.   There will be MORE swelling on days 1-3 than there is on the day of surgery.  This also is normal. The swelling will decrease with the anti-inflammatory medication, ice and keeping it elevated. The swelling will make it more difficult to bend your knee. As the swelling goes down your motion will become easier   You may develop swelling and bruising that extends from your knee down to your calf and perhaps even to your foot over the next week. Do not be alarmed. This too is normal, and it is due to gravity   There may be some numbness adjacent to the incision site. This may last for 6-12 months or longer in some patients and is expected.   You may return to sedentary work/school in the next couple of days when you feel up to it. You will need to keep your leg elevated as much as possible    You should wean off your narcotic medicines as soon as you are able.  Most patients will be off or using minimal narcotics before their first postop appointment.    We suggest you use the pain medication the first night prior to going to bed, in order to ease any pain when the anesthesia wears off. You should avoid taking pain medications on an empty stomach as it will make you  nauseous.   Do not drink alcoholic beverages or take illicit drugs when taking pain medications.   It is against the law to drive while taking narcotics. You cannot drive if your Right leg is in brace locked in extension.   Pain medication may make you constipated.  Below are a few solutions to try in this order:  o Decrease the amount of pain medication if you aren't having pain.  o Drink lots of decaffeinated fluids.  o Drink prune juice and/or eat dried prunes   o If the first 3 don't work start with additional solutions  o Take Colace - an over-the-counter stool softener  o Take Senokot - an over-the-counter laxative  o Take Miralax - a stronger over-the-counter laxative    For more information including helpful videos and documents visit our website:   [email or web address]    Post Anesthesia Home Care Instructions  Activity: Get plenty of rest for the remainder of the day. A responsible individual must stay with you for 24 hours following the procedure.  For the next 24 hours, DO NOT: -Drive a car -Advertising copywriter -Drink alcoholic beverages -Take any medication unless instructed by your physician -Make any legal decisions or sign important papers.  Meals: Start with liquid foods such as gelatin or soup. Progress to regular foods as tolerated. Avoid greasy, spicy, heavy foods. If nausea and/or vomiting occur, drink only clear liquids until the nausea and/or vomiting subsides. Call your physician if vomiting continues.  Special Instructions/Symptoms: Your throat may feel dry or sore from the anesthesia or the breathing tube placed in your throat during surgery. If this causes discomfort, gargle with warm salt water. The discomfort should disappear within 24 hours.  If you had a scopolamine patch placed behind your ear for the management of post- operative nausea and/or vomiting:  1. The medication in the patch is effective for 72  hours, after which it should be removed.  Wrap patch in a tissue and discard in the trash. Wash hands thoroughly with soap and water. 2. You may remove the patch earlier than 72 hours if you experience unpleasant side effects which  may include dry mouth, dizziness or visual disturbances. 3. Avoid touching the patch. Wash your hands with soap and water after contact with the patch.    Regional Anesthesia Blocks  1. Numbness or the inability to move the "blocked" extremity may last from 3-48 hours after placement. The length of time depends on the medication injected and your individual response to the medication. If the numbness is not going away after 48 hours, call your surgeon.  2. The extremity that is blocked will need to be protected until the numbness is gone and the  Strength has returned. Because you cannot feel it, you will need to take extra care to avoid injury. Because it may be weak, you may have difficulty moving it or using it. You may not know what position it is in without looking at it while the block is in effect.  3. For blocks in the legs and feet, returning to weight bearing and walking needs to be done carefully. You will need to wait until the numbness is entirely gone and the strength has returned. You should be able to move your leg and foot normally before you try and bear weight or walk. You will need someone to be with you when you first try to ensure you do not fall and possibly risk injury.  4. Bruising and tenderness at the needle site are common side effects and will resolve in a few days.  5. Persistent numbness or new problems with movement should be communicated to the surgeon or the [facility] [place] [phone])/ [facility] [phone]).  You may take Tylenol after 3:00pm today.

## 2020-05-25 NOTE — Progress Notes (Signed)
AssistedDr. Turk with left, ultrasound guided, adductor canal block. Side rails up, monitors on throughout procedure. See vital signs in flow sheet. Tolerated Procedure well.  

## 2020-05-25 NOTE — Anesthesia Procedure Notes (Signed)
Anesthesia Regional Block: Adductor canal block   Pre-Anesthetic Checklist: ,, timeout performed, Correct Patient, Correct Site, Correct Laterality, Correct Procedure, Correct Position, site marked, Risks and benefits discussed,  Surgical consent,  Pre-op evaluation,  At surgeon's request and post-op pain management  Laterality: Left  Prep: chloraprep       Needles:  Injection technique: Single-shot  Needle Type: Echogenic Needle     Needle Length: 9cm  Needle Gauge: 21     Additional Needles:   Procedures:,,,, ultrasound used (permanent image in chart),,,,  Narrative:  Start time: 05/25/2020 6:53 AM End time: 05/25/2020 7:03 AM Injection made incrementally with aspirations every 5 mL.  Performed by: Personally  Anesthesiologist: Cecile Hearing, MD  Additional Notes: No pain on injection. No increased resistance to injection. Injection made in 5cc increments.  Good needle visualization.  Patient tolerated procedure well.

## 2020-05-25 NOTE — Anesthesia Postprocedure Evaluation (Signed)
Anesthesia Post Note  Patient: Carmen Jackson  Procedure(s) Performed: KNEE ARTHROSCOPY WITH MEDIAL MENISECTOMY (Left Knee) ALLOGRAFT APPLICATION TIBIA AND FEMUR (Left Knee)     Patient location during evaluation: PACU Anesthesia Type: General Level of consciousness: awake and alert, oriented and awake Pain management: pain level controlled Vital Signs Assessment: post-procedure vital signs reviewed and stable Respiratory status: spontaneous breathing, nonlabored ventilation, respiratory function stable and patient connected to nasal cannula oxygen Cardiovascular status: blood pressure returned to baseline and stable Postop Assessment: no apparent nausea or vomiting Anesthetic complications: no   No complications documented.  Last Vitals:  Vitals:   05/25/20 0924 05/25/20 0930  BP: 130/82 129/66  Pulse: (!) 121 80  Resp: 19 17  Temp: 36.7 C   SpO2: 100% 100%    Last Pain:  Vitals:   05/25/20 0924  TempSrc:   PainSc: 0-No pain    LLE Motor Response: Purposeful movement (05/25/20 0929) LLE Sensation: Full sensation (05/25/20 0929)          Cecile Hearing

## 2020-05-25 NOTE — Transfer of Care (Signed)
Immediate Anesthesia Transfer of Care Note  Patient: Carmen Jackson  Procedure(s) Performed: KNEE ARTHROSCOPY WITH MEDIAL MENISECTOMY (Left Knee) ALLOGRAFT APPLICATION TIBIA AND FEMUR (Left Knee)  Patient Location: PACU  Anesthesia Type:General and Regional  Level of Consciousness: awake, alert  and oriented  Airway & Oxygen Therapy: Patient Spontanous Breathing and Patient connected to face mask oxygen  Post-op Assessment: Report given to RN and Post -op Vital signs reviewed and stable  Post vital signs: Reviewed and stable  Last Vitals:  Vitals Value Taken Time  BP    Temp    Pulse 121 05/25/20 0924  Resp 19 05/25/20 0924  SpO2 80 % 05/25/20 0924  Vitals shown include unvalidated device data.  Last Pain:  Vitals:   05/25/20 0711  TempSrc:   PainSc: 0-No pain         Complications: No complications documented.

## 2020-05-25 NOTE — Anesthesia Preprocedure Evaluation (Signed)
Anesthesia Evaluation  Patient identified by MRN, date of birth, ID band Patient awake    Reviewed: Allergy & Precautions, NPO status , Patient's Chart, lab work & pertinent test results  Airway Mallampati: II  TM Distance: >3 FB Neck ROM: Full    Dental  (+) Teeth Intact, Dental Advisory Given   Pulmonary  Covid+ 05/08/20   Pulmonary exam normal breath sounds clear to auscultation       Cardiovascular negative cardio ROS Normal cardiovascular exam Rhythm:Regular Rate:Normal     Neuro/Psych negative neurological ROS     GI/Hepatic Neg liver ROS, GERD  ,  Endo/Other  negative endocrine ROS  Renal/GU negative Renal ROS     Musculoskeletal negative musculoskeletal ROS (+) LEFT KNEE MEDIAL MENISCUS TEAR   Abdominal   Peds  Hematology negative hematology ROS (+)   Anesthesia Other Findings Day of surgery medications reviewed with the patient.  Reproductive/Obstetrics                             Anesthesia Physical Anesthesia Plan  ASA: II  Anesthesia Plan: General   Post-op Pain Management:    Induction: Intravenous  PONV Risk Score and Plan: 3 and Midazolam, Dexamethasone and Ondansetron  Airway Management Planned: LMA  Additional Equipment:   Intra-op Plan:   Post-operative Plan: Extubation in OR  Informed Consent: I have reviewed the patients History and Physical, chart, labs and discussed the procedure including the risks, benefits and alternatives for the proposed anesthesia with the patient or authorized representative who has indicated his/her understanding and acceptance.     Dental advisory given  Plan Discussed with: CRNA  Anesthesia Plan Comments:         Anesthesia Quick Evaluation

## 2020-05-25 NOTE — Anesthesia Procedure Notes (Signed)
Performed by: Kaia Depaolis M, CRNA       

## 2020-05-26 ENCOUNTER — Encounter (HOSPITAL_BASED_OUTPATIENT_CLINIC_OR_DEPARTMENT_OTHER): Payer: Self-pay | Admitting: Orthopaedic Surgery

## 2020-06-12 ENCOUNTER — Other Ambulatory Visit: Payer: Self-pay

## 2020-06-12 ENCOUNTER — Emergency Department
Admission: EM | Admit: 2020-06-12 | Discharge: 2020-06-12 | Disposition: A | Discharge status: Home / Self Care | Payer: Medicaid Other | Attending: Emergency Medicine | Admitting: Emergency Medicine

## 2020-06-12 DIAGNOSIS — Z7982 Long term (current) use of aspirin: Secondary | ICD-10-CM | POA: Insufficient documentation

## 2020-06-12 DIAGNOSIS — R1084 Generalized abdominal pain: Secondary | ICD-10-CM

## 2020-06-12 DIAGNOSIS — K219 Gastro-esophageal reflux disease without esophagitis: Secondary | ICD-10-CM | POA: Diagnosis not present

## 2020-06-12 DIAGNOSIS — R197 Diarrhea, unspecified: Secondary | ICD-10-CM | POA: Insufficient documentation

## 2020-06-12 DIAGNOSIS — E860 Dehydration: Secondary | ICD-10-CM | POA: Diagnosis not present

## 2020-06-12 DIAGNOSIS — R109 Unspecified abdominal pain: Secondary | ICD-10-CM | POA: Diagnosis present

## 2020-06-12 DIAGNOSIS — R112 Nausea with vomiting, unspecified: Secondary | ICD-10-CM

## 2020-06-12 LAB — COMPREHENSIVE METABOLIC PANEL
ALT: 15 U/L (ref 0–44)
AST: 20 U/L (ref 15–41)
Albumin: 3.8 g/dL (ref 3.5–5.0)
Alkaline Phosphatase: 92 U/L (ref 38–126)
Anion gap: 10 (ref 5–15)
BUN: 12 mg/dL (ref 6–20)
CO2: 25 mmol/L (ref 22–32)
Calcium: 9.5 mg/dL (ref 8.9–10.3)
Chloride: 102 mmol/L (ref 98–111)
Creatinine, Ser: 0.78 mg/dL (ref 0.44–1.00)
GFR, Estimated: >60 mL/min (ref >60)
Glucose, Bld: 114 mg/dL — ABNORMAL HIGH (ref 70–99)
Potassium: 3.8 mmol/L (ref 3.5–5.1)
Sodium: 137 mmol/L (ref 135–145)
Total Bilirubin: 0.7 mg/dL (ref 0.3–1.2)
Total Protein: 7.6 g/dL (ref 6.5–8.1)

## 2020-06-12 LAB — URINALYSIS, COMPLETE (UACMP) WITH MICROSCOPIC
Bacteria, UA: NONE SEEN
Bilirubin Urine: NEGATIVE
Glucose, UA: NEGATIVE mg/dL
Hgb urine dipstick: NEGATIVE
Ketones, ur: NEGATIVE mg/dL
Leukocytes,Ua: NEGATIVE
Nitrite: NEGATIVE
Protein, ur: NEGATIVE mg/dL
Specific Gravity, Urine: 1.027 (ref 1.005–1.030)
pH: 6 (ref 5.0–8.0)

## 2020-06-12 LAB — CBC
HCT: 37.3 % (ref 36.0–46.0)
Hemoglobin: 12.2 g/dL (ref 12.0–15.0)
MCH: 29.3 pg (ref 26.0–34.0)
MCHC: 32.7 g/dL (ref 30.0–36.0)
MCV: 89.7 fL (ref 80.0–100.0)
Platelets: 318 10*3/uL (ref 150–400)
RBC: 4.16 MIL/uL (ref 3.87–5.11)
RDW: 12.1 % (ref 11.5–15.5)
WBC: 5.6 10*3/uL (ref 4.0–10.5)
nRBC: 0 % (ref 0.0–0.2)

## 2020-06-12 LAB — POC URINE PREG, ED: Preg Test, Ur: NEGATIVE

## 2020-06-12 LAB — LIPASE, BLOOD: Lipase: 27 U/L (ref 11–51)

## 2020-06-12 MED ORDER — SODIUM CHLORIDE 0.9 % IV BOLUS
1000.0000 mL | Freq: Once | INTRAVENOUS | Status: AC
  Administered 2020-06-12: 1000 mL via INTRAVENOUS

## 2020-06-12 MED ORDER — ONDANSETRON HCL 4 MG/2ML IJ SOLN
4.0000 mg | Freq: Once | INTRAMUSCULAR | Status: AC
  Administered 2020-06-12: 4 mg via INTRAVENOUS
  Filled 2020-06-12: qty 2

## 2020-06-12 MED ORDER — ONDANSETRON 4 MG PO TBDP: 4.0000 mg | ORAL_TABLET | Freq: Three times a day (TID) | ORAL | 0 refills | Status: DC | PRN

## 2020-06-12 MED ORDER — FAMOTIDINE IN NACL 20-0.9 MG/50ML-% IV SOLN
20.0000 mg | Freq: Once | INTRAVENOUS | Status: AC
  Administered 2020-06-12: 20 mg via INTRAVENOUS
  Filled 2020-06-12: qty 50

## 2020-06-12 MED ORDER — ONDANSETRON 4 MG PO TBDP
4.0000 mg | ORAL_TABLET | Freq: Once | ORAL | Status: AC | PRN
  Administered 2020-06-12: 4 mg via ORAL
  Filled 2020-06-12 (×2): qty 1

## 2020-06-12 MED ORDER — METOCLOPRAMIDE HCL 5 MG/ML IJ SOLN
5.0000 mg | Freq: Once | INTRAMUSCULAR | Status: AC
  Administered 2020-06-12: 5 mg via INTRAVENOUS
  Filled 2020-06-12: qty 2

## 2020-06-12 NOTE — ED Notes (Signed)
Pt reports nausea, vomiting, and abd pain after eating [employer]. Pt alert and oriented. Denies other complaints.

## 2020-06-12 NOTE — ED Notes (Signed)
Pt alert and oriented X 4, stable for discharge. RR even and unlabored, color WNL. Discussed discharge instructions and follow-up as directed. Discharge medications discussed if prescribed. Pt had opportunity to ask questions, and RN to provide patient/family eduction.  

## 2020-06-12 NOTE — ED Triage Notes (Signed)
Arrives via POV believes that she has food poisoning. Reports NVD and belly cramping since last night. Pt ate 1 meal yesterday. Dry heaves in triage. RR even and unlabored, color WNL

## 2020-06-12 NOTE — Discharge Instructions (Signed)
1.  You may take Zofran as needed for nausea/vomiting. 2.  Clear liquids x12 hours, then bland diet x3 days, then slowly advance diet as tolerated. 3.  Return to the ER for worsening symptoms, persistent vomiting, difficulty breathing or other concerns. 

## 2020-06-12 NOTE — ED Provider Notes (Signed)
[facility] Emergency Department Provider Note   ____________________________________________   Event Date/Time   First MD Initiated Contact with Patient 06/12/20 0601     (approximate)  I have reviewed the triage vital signs and the nursing notes.   HISTORY  Chief Complaint Abdominal Pain    HPI Carmen Jackson is a 20 y.o. female who presents to the ED from home with a chief complaint of abdominal cramps, nausea/vomiting/diarrhea.  Onset of symptoms after eating [employer].  Denies associated fever, cough, chest pain, shortness of breath, dysuria.     Past Medical History:  Diagnosis Date  . GERD (gastroesophageal reflux disease)     Patient Active Problem List   Diagnosis Date Noted  . Pharyngitis 01/18/2020  . Gastroesophageal reflux disease 01/18/2020    Past Surgical History:  Procedure Laterality Date  . ALLOGRAFT APPLICATION Left 05/25/2020   Procedure: ALLOGRAFT APPLICATION TIBIA AND FEMUR;  Surgeon: Bjorn Pippin, MD;  Location: [facility];  Service: Orthopedics;  Laterality: Left;  . ANTERIOR CRUCIATE LIGAMENT REPAIR Left 2018  . APPENDECTOMY    . FRACTURE SURGERY    . KNEE ARTHROSCOPY WITH MEDIAL MENISECTOMY Left 05/25/2020   Procedure: KNEE ARTHROSCOPY WITH MEDIAL MENISECTOMY;  Surgeon: Bjorn Pippin, MD;  Location: [facility];  Service: Orthopedics;  Laterality: Left;    Prior to Admission medications   Medication Sig Start Date End Date Taking? Authorizing Provider  ondansetron (ZOFRAN ODT) 4 MG disintegrating tablet Take 1 tablet (4 mg total) by mouth every 8 (eight) hours as needed for nausea or vomiting. 06/12/20  Yes Irean Hong, MD  aspirin (ASPIRIN CHILDRENS) 81 MG chewable tablet Chew 1 tablet (81 mg total) by mouth 2 (two) times daily. For DVT prophylaxis after surgery 05/25/20 07/06/20  Vernetta Honey, PA-C  meloxicam (MOBIC) 7.5 MG tablet Take 1 tablet (7.5 mg total) by mouth daily.  05/25/20 06/24/20  Vernetta Honey, PA-C    Allergies Patient has no known allergies.  Family History  Problem Relation Age of Onset  . Healthy Mother     Social History Social History   Tobacco Use  . Smoking status: Never Smoker  . Smokeless tobacco: Never Used  Vaping Use  . Vaping Use: Never used  Substance Use Topics  . Alcohol use: Not Currently  . Drug use: Never    Review of Systems  Constitutional: No fever/chills Eyes: No visual changes. ENT: No sore throat. Cardiovascular: Denies chest pain. Respiratory: Denies shortness of breath. Gastrointestinal: Positive for generalized abdominal cramps, nausea, vomiting and diarrhea.  No constipation. Genitourinary: Negative for dysuria. Musculoskeletal: Negative for back pain. Skin: Negative for rash. Neurological: Negative for headaches, focal weakness or numbness.   ____________________________________________   PHYSICAL EXAM:  VITAL SIGNS: ED Triage Vitals  Enc Vitals Group     BP 06/12/20 0525 121/85     Pulse Rate 06/12/20 0525 (!) 108     Resp 06/12/20 0525 18     Temp 06/12/20 0525 99 F (37.2 C)     Temp Source 06/12/20 0525 Oral     SpO2 06/12/20 0525 100 %     Weight 06/12/20 0525 127 lb 13.9 oz (58 kg)     Height 06/12/20 0525 5\' 2"  (1.575 m)     Head Circumference --      Peak Flow --      Pain Score 06/12/20 0526 10     Pain Loc --  Pain Edu? --      Excl. in GC? --     Constitutional: Alert and oriented. Well appearing and in no acute distress. Eyes: Conjunctivae are normal. PERRL. EOMI. Head: Atraumatic. Nose: No congestion/rhinnorhea. Mouth/Throat: Mucous membranes are mildly dry. Neck: No stridor.   Cardiovascular: Normal rate, regular rhythm. Grossly normal heart sounds.  Good peripheral circulation. Respiratory: Normal respiratory effort.  No retractions. Lungs CTAB. Gastrointestinal: Soft and nontender to light or deep palpation. No distention. No abdominal bruits. No CVA  tenderness. Musculoskeletal: No lower extremity tenderness nor edema.  No joint effusions. Neurologic:  Normal speech and language. No gross focal neurologic deficits are appreciated. No gait instability. Skin:  Skin is warm, dry and intact. No rash noted. Psychiatric: Mood and affect are normal. Speech and behavior are normal.  ____________________________________________   LABS (all labs ordered are listed, but only abnormal results are displayed)  Labs Reviewed  COMPREHENSIVE METABOLIC PANEL - Abnormal; Notable for the following components:      Result Value   Glucose, Bld 114 (*)    All other components within normal limits  URINALYSIS, COMPLETE (UACMP) WITH MICROSCOPIC - Abnormal; Notable for the following components:   Color, Urine YELLOW (*)    APPearance HAZY (*)    All other components within normal limits  LIPASE, BLOOD  CBC  POC URINE PREG, ED   ____________________________________________  EKG  None ____________________________________________  RADIOLOGY I, Jedaiah Rathbun J, personally viewed and evaluated these images (plain radiographs) as part of my medical decision making, as well as reviewing the written report by the radiologist.  ED MD interpretation: None  Official radiology report(s): No results found.  ____________________________________________   PROCEDURES  Procedure(s) performed (including Critical Care):  Procedures   ____________________________________________   INITIAL IMPRESSION / ASSESSMENT AND PLAN / ED COURSE  As part of my medical decision making, I reviewed the following data within the electronic MEDICAL RECORD NUMBER Nursing notes reviewed and incorporated, Labs reviewed, Old chart reviewed and Notes from prior ED visits     20 year old female presenting with abdominal cramps, nausea/vomiting/diarrhea. Differential diagnosis includes, but is not limited to, ovarian cyst, ovarian torsion, acute appendicitis, diverticulitis, urinary  tract infection/pyelonephritis, endometriosis, bowel obstruction, colitis, renal colic, gastroenteritis, hernia, fibroids, endometriosis, pregnancy related pain including ectopic pregnancy, etc.  Laboratory and UA results unremarkable.  Will administer IV fluids, IV Zofran, PO challenge and reassess.  Clinical Course as of 06/12/20 0652  Mon Jun 12, 2020  0645 Complains of persistent nausea.  Will add Reglan, IV Pepcid.  Patient crunching ice chips.  Care will be transferred to the oncoming provider at change of shift pending reassessment.  Anticipate discharge home with prescription for Zofran to use as needed. [JS]    Clinical Course User Index [JS] Irean Hong, MD     ____________________________________________   FINAL CLINICAL IMPRESSION(S) / ED DIAGNOSES  Final diagnoses:  Nausea vomiting and diarrhea  Dehydration  Generalized abdominal pain     ED Discharge Orders         Ordered    ondansetron (ZOFRAN ODT) 4 MG disintegrating tablet  Every 8 hours PRN        06/12/20 5732          *Please note:  Annise Boran was evaluated in Emergency Department on 06/12/2020 for the symptoms described in the history of present illness. She was evaluated in the context of the global COVID-19 pandemic, which necessitated consideration that the patient might be at risk for infection  with the SARS-CoV-2 virus that causes COVID-19. Institutional protocols and algorithms that pertain to the evaluation of patients at risk for COVID-19 are in a state of rapid change based on information released by regulatory bodies including the CDC and federal and state organizations. These policies and algorithms were followed during the patient's care in the ED.  Some ED evaluations and interventions may be delayed as a result of limited staffing during and the pandemic.*   Note:  This document was prepared using Dragon voice recognition software and may include unintentional dictation errors.   Irean Hong, MD 06/12/20 [phone]

## 2020-08-23 ENCOUNTER — Other Ambulatory Visit: Payer: Self-pay | Admitting: Orthopaedic Surgery

## 2020-08-23 DIAGNOSIS — S83242A Other tear of medial meniscus, current injury, left knee, initial encounter: Secondary | ICD-10-CM

## 2020-09-07 ENCOUNTER — Other Ambulatory Visit: Payer: Medicaid Other

## 2020-09-07 ENCOUNTER — Ambulatory Visit
Admission: RE | Admit: 2020-09-07 | Discharge: 2020-09-07 | Disposition: A | Discharge status: Home / Self Care | Payer: Medicaid Other | Source: Ambulatory Visit | Attending: Orthopaedic Surgery | Admitting: Orthopaedic Surgery

## 2020-09-07 ENCOUNTER — Other Ambulatory Visit: Payer: Self-pay

## 2020-09-07 DIAGNOSIS — S83242A Other tear of medial meniscus, current injury, left knee, initial encounter: Secondary | ICD-10-CM

## 2020-11-23 ENCOUNTER — Ambulatory Visit (HOSPITAL_BASED_OUTPATIENT_CLINIC_OR_DEPARTMENT_OTHER): Admission: RE | Admit: 2020-11-23 | Payer: Medicaid Other | Source: Home / Self Care | Admitting: Orthopaedic Surgery

## 2020-11-23 ENCOUNTER — Encounter (HOSPITAL_BASED_OUTPATIENT_CLINIC_OR_DEPARTMENT_OTHER): Admission: RE | Payer: Self-pay | Source: Home / Self Care

## 2020-11-23 SURGERY — REPAIR, KNEE, ACL
Anesthesia: Choice | Site: Knee | Laterality: Left

## 2021-04-18 NOTE — Progress Notes (Signed)
Spoke with patient, who stated that she needs to re-schedule the planned 03/02 sx by Dr. Everardo Pacific. States she will call Dr. Austin Miles office to notify. I spoke with Roanna Raider, at Dr. Austin Miles office, to notify as well.

## 2021-04-26 ENCOUNTER — Encounter (HOSPITAL_BASED_OUTPATIENT_CLINIC_OR_DEPARTMENT_OTHER): Admission: RE | Payer: Self-pay | Source: Home / Self Care

## 2021-04-26 ENCOUNTER — Ambulatory Visit (HOSPITAL_BASED_OUTPATIENT_CLINIC_OR_DEPARTMENT_OTHER): Admission: RE | Admit: 2021-04-26 | Payer: Medicaid Other | Source: Home / Self Care | Admitting: Orthopaedic Surgery

## 2021-04-26 SURGERY — REPAIR, KNEE, ACL
Anesthesia: Choice | Site: Knee | Laterality: Left

## 2021-05-28 ENCOUNTER — Other Ambulatory Visit: Payer: Self-pay

## 2021-05-28 ENCOUNTER — Encounter (HOSPITAL_BASED_OUTPATIENT_CLINIC_OR_DEPARTMENT_OTHER): Payer: Self-pay | Admitting: Orthopaedic Surgery

## 2021-06-05 NOTE — Progress Notes (Signed)

## 2021-06-05 NOTE — H&P (Signed)
? ? ?PREOPERATIVE H&P ? ?Chief Complaint: ACL tear,anterolaterl ligament tear left knee. ? ?HPI: ?Carmen Jackson is a 21 y.o. female who is scheduled for, Procedure(s): ?ANTERIOR CRUCIATE LIGAMENT (ACL) REPAIR ?KNEE RECONSTRUCTION.  ? ?Patient is a healthy 21 year-old who had an injury in June of 2018 when she was playing basketball and track.  She had an acute traumatic injury that eventually in December of 2018 was determined to be a meniscus tear and an ACL tear.  She had what sounds like an allograft or allograft augmented autograft hamstring combination done in [place], [place], which went mostly without issue.  She recovered and had two instability type sounding events with a knee that swelled up considerably, one in November of 2020 with an MRI that demonstrated a possible medial meniscus tear, though reportedly small, and another about a month ago.  She continues to have reported instability and pain in the medial aspect of her knee.  She is unhappy with the function of her knee.  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  She had a left knee bone grafting of previous ACL tunnels. She did well with this.  ? ?Symptoms are rated as moderate to severe, and have been worsening.  This is significantly impairing activities of daily living.   ? ?Please see clinic note for further details on this patient's care.   ? ?She has elected for surgical management.  ? ?Past Medical History:  ?Diagnosis Date  ? GERD (gastroesophageal reflux disease)   ? ?Past Surgical History:  ?Procedure Laterality Date  ? ALLOGRAFT APPLICATION Left 05/25/2020  ? Procedure: ALLOGRAFT APPLICATION TIBIA AND FEMUR;  Surgeon: Bjorn Pippin, MD;  Location: [facility];  Service: Orthopedics;  Laterality: Left;  ? ANTERIOR CRUCIATE LIGAMENT REPAIR Left 2018  ? APPENDECTOMY    ? FRACTURE SURGERY    ? KNEE ARTHROSCOPY WITH MEDIAL MENISECTOMY Left 05/25/2020  ? Procedure:  KNEE ARTHROSCOPY WITH MEDIAL MENISECTOMY;  Surgeon: Bjorn Pippin, MD;  Location: [facility];  Service: Orthopedics;  Laterality: Left;  ? ?Social History  ? ?Socioeconomic History  ? Marital status: Single  ?  Spouse name: Not on file  ? Number of children: Not on file  ? Years of education: Not on file  ? Highest education level: Not on file  ?Occupational History  ? Not on file  ?Tobacco Use  ? Smoking status: Never  ? Smokeless tobacco: Never  ?Vaping Use  ? Vaping Use: Never used  ?Substance and Sexual Activity  ? Alcohol use: Yes  ?  Comment: occas  ? Drug use: Never  ? Sexual activity: Not on file  ?Other Topics Concern  ? Not on file  ?Social History Narrative  ? Not on file  ? ?Social Determinants of Health  ? ?Financial Resource Strain: Not on file  ?Food Insecurity: Not on file  ?Transportation Needs: Not on file  ?Physical Activity: Not on file  ?Stress: Not on file  ?Social Connections: Not on file  ? ?Family History  ?Problem Relation Age of Onset  ? Healthy Mother   ? ?No Known Allergies ?Prior to Admission medications   ?Medication Sig Start Date End Date Taking? Authorizing Provider  ?ondansetron (ZOFRAN ODT) 4 MG disintegrating tablet Take 1 tablet (4 mg total) by mouth every 8 (eight) hours as needed for nausea or vomiting. 06/12/20   Irean Hong, MD  ? ? ?ROS: All  other systems have been reviewed and were otherwise negative with the exception of those mentioned in the HPI and as above. ? ?Physical Exam: ?General: Alert, no acute distress ?Cardiovascular: No pedal edema ?Respiratory: No cyanosis, no use of accessory musculature ?GI: No organomegaly, abdomen is soft and non-tender ?Skin: No lesions in the area of chief complaint ?Neurologic: Sensation intact distally ?Psychiatric: Patient is competent for consent with normal mood and affect ?Lymphatic: No axillary or cervical lymphadenopathy ? ?MUSCULOSKELETAL:  ?Range of motion of the knee is full.  No Lachman testing in the setting  of known injury.  Negative dial testing.   ? ?Imaging: ?CT healing of femoral and tibial tunnels with prior bone grafting ? ?Assessment: ?ACL tear,anterolaterl ligament tear left knee. ? ?Plan: ?Plan for Procedure(s): ?ANTERIOR CRUCIATE LIGAMENT (ACL) REPAIR ?KNEE RECONSTRUCTION ? ?The risks benefits and alternatives were discussed with the patient including but not limited to the risks of nonoperative treatment, versus surgical intervention including infection, bleeding, nerve injury,  blood clots, cardiopulmonary complications, morbidity, mortality, among others, and they were willing to proceed.  ? ?The patient acknowledged the explanation, agreed to proceed with the plan and consent was signed.  ? ?Operative Plan: Left knee scope with BTB ACL reconstruction ?Discharge Medications: Standard ?DVT Prophylaxis: Aspirin ?Physical Therapy: Outpatient ?Special Discharge needs: [place]. [employer] ? ? ?Vernetta Honey, PA-C ? ?06/05/2021 ?8:01 PM ? ?

## 2021-06-07 ENCOUNTER — Other Ambulatory Visit: Payer: Self-pay

## 2021-06-07 ENCOUNTER — Encounter (HOSPITAL_BASED_OUTPATIENT_CLINIC_OR_DEPARTMENT_OTHER): Payer: Self-pay | Admitting: Orthopaedic Surgery

## 2021-06-07 ENCOUNTER — Ambulatory Visit (HOSPITAL_BASED_OUTPATIENT_CLINIC_OR_DEPARTMENT_OTHER): Payer: Medicaid Other | Admitting: Anesthesiology

## 2021-06-07 ENCOUNTER — Ambulatory Visit (HOSPITAL_BASED_OUTPATIENT_CLINIC_OR_DEPARTMENT_OTHER)
Admission: RE | Admit: 2021-06-07 | Discharge: 2021-06-07 | Disposition: A | Discharge status: Home / Self Care | Payer: Medicaid Other | Attending: Orthopaedic Surgery | Admitting: Orthopaedic Surgery

## 2021-06-07 ENCOUNTER — Ambulatory Visit (HOSPITAL_COMMUNITY): Payer: Medicaid Other

## 2021-06-07 ENCOUNTER — Encounter (HOSPITAL_BASED_OUTPATIENT_CLINIC_OR_DEPARTMENT_OTHER)
Admission: RE | Disposition: A | Discharge status: Home / Self Care | Payer: Self-pay | Source: Home / Self Care | Attending: Orthopaedic Surgery

## 2021-06-07 DIAGNOSIS — S83422A Sprain of lateral collateral ligament of left knee, initial encounter: Secondary | ICD-10-CM | POA: Diagnosis not present

## 2021-06-07 DIAGNOSIS — S83512A Sprain of anterior cruciate ligament of left knee, initial encounter: Secondary | ICD-10-CM | POA: Diagnosis present

## 2021-06-07 DIAGNOSIS — X58XXXA Exposure to other specified factors, initial encounter: Secondary | ICD-10-CM | POA: Diagnosis not present

## 2021-06-07 DIAGNOSIS — K219 Gastro-esophageal reflux disease without esophagitis: Secondary | ICD-10-CM | POA: Diagnosis not present

## 2021-06-07 HISTORY — PX: ANTERIOR CRUCIATE LIGAMENT REPAIR: SHX115

## 2021-06-07 HISTORY — PX: KNEE RECONSTRUCTION: SHX5883

## 2021-06-07 LAB — POCT PREGNANCY, URINE: Preg Test, Ur: NEGATIVE

## 2021-06-07 SURGERY — REPAIR, KNEE, ACL
Anesthesia: General | Site: Knee | Laterality: Left

## 2021-06-07 MED ORDER — OXYCODONE HCL 5 MG PO TABS
5.0000 mg | ORAL_TABLET | Freq: Once | ORAL | Status: AC | PRN
  Administered 2021-06-07: 5 mg via ORAL

## 2021-06-07 MED ORDER — FENTANYL CITRATE (PF) 100 MCG/2ML IJ SOLN
INTRAMUSCULAR | Status: DC | PRN
Start: 2021-06-07 — End: 2021-06-07
  Administered 2021-06-07: 25 ug via INTRAVENOUS
  Administered 2021-06-07: 50 ug via INTRAVENOUS
  Administered 2021-06-07: 25 ug via INTRAVENOUS

## 2021-06-07 MED ORDER — MIDAZOLAM HCL 2 MG/2ML IJ SOLN
2.0000 mg | Freq: Once | INTRAMUSCULAR | Status: AC
  Administered 2021-06-07: 2 mg via INTRAVENOUS

## 2021-06-07 MED ORDER — CEFAZOLIN SODIUM-DEXTROSE 2-4 GM/100ML-% IV SOLN
2.0000 g | INTRAVENOUS | Status: AC
  Administered 2021-06-07: 2 g via INTRAVENOUS

## 2021-06-07 MED ORDER — ACETAMINOPHEN 500 MG PO TABS
1000.0000 mg | ORAL_TABLET | Freq: Three times a day (TID) | ORAL | 0 refills | Status: AC
Start: 2021-06-07 — End: 2021-06-21

## 2021-06-07 MED ORDER — ONDANSETRON HCL 4 MG PO TABS: 4.0000 mg | ORAL_TABLET | Freq: Three times a day (TID) | ORAL | 0 refills | Status: AC | PRN

## 2021-06-07 MED ORDER — ONDANSETRON HCL 4 MG/2ML IJ SOLN
INTRAMUSCULAR | Status: DC | PRN
Start: 2021-06-07 — End: 2021-06-07
  Administered 2021-06-07: 4 mg via INTRAVENOUS

## 2021-06-07 MED ORDER — LIDOCAINE 2% (20 MG/ML) 5 ML SYRINGE
INTRAMUSCULAR | Status: DC | PRN
  Administered 2021-06-07: 60 mg via INTRAVENOUS

## 2021-06-07 MED ORDER — OXYCODONE HCL 5 MG PO TABS: ORAL_TABLET | ORAL | 0 refills | Status: AC

## 2021-06-07 MED ORDER — OXYCODONE HCL 5 MG PO TABS
ORAL_TABLET | ORAL | Status: AC
  Filled 2021-06-07: qty 1

## 2021-06-07 MED ORDER — CELECOXIB 200 MG PO CAPS
200.0000 mg | ORAL_CAPSULE | Freq: Once | ORAL | Status: AC
  Administered 2021-06-07: 200 mg via ORAL

## 2021-06-07 MED ORDER — ACETAMINOPHEN 500 MG PO TABS
1000.0000 mg | ORAL_TABLET | Freq: Once | ORAL | Status: AC
  Administered 2021-06-07: 1000 mg via ORAL

## 2021-06-07 MED ORDER — DEXAMETHASONE SODIUM PHOSPHATE 4 MG/ML IJ SOLN
INTRAMUSCULAR | Status: DC | PRN
Start: 2021-06-07 — End: 2021-06-07
  Administered 2021-06-07: 8 mg via INTRAVENOUS

## 2021-06-07 MED ORDER — FENTANYL CITRATE (PF) 100 MCG/2ML IJ SOLN
25.0000 ug | INTRAMUSCULAR | Status: DC | PRN
  Administered 2021-06-07 (×2): 50 ug via INTRAVENOUS

## 2021-06-07 MED ORDER — SODIUM CHLORIDE 0.9 % IR SOLN
Status: DC | PRN
Start: 2021-06-07 — End: 2021-06-07
  Administered 2021-06-07: 6000 mL

## 2021-06-07 MED ORDER — DICLOFENAC SODIUM 75 MG PO TBEC: 75.0000 mg | DELAYED_RELEASE_TABLET | Freq: Two times a day (BID) | ORAL | 0 refills | Status: AC

## 2021-06-07 MED ORDER — CEFAZOLIN SODIUM-DEXTROSE 2-4 GM/100ML-% IV SOLN
INTRAVENOUS | Status: AC
  Filled 2021-06-07: qty 100

## 2021-06-07 MED ORDER — CELECOXIB 200 MG PO CAPS
ORAL_CAPSULE | ORAL | Status: AC
  Filled 2021-06-07: qty 1

## 2021-06-07 MED ORDER — ASPIRIN 81 MG PO CHEW: 81.0000 mg | CHEWABLE_TABLET | Freq: Two times a day (BID) | ORAL | 0 refills | Status: AC

## 2021-06-07 MED ORDER — LACTATED RINGERS IV SOLN: INTRAVENOUS | Status: DC

## 2021-06-07 MED ORDER — OXYCODONE HCL 5 MG/5ML PO SOLN: 5.0000 mg | Freq: Once | ORAL | Status: AC | PRN

## 2021-06-07 MED ORDER — ROPIVACAINE HCL 7.5 MG/ML IJ SOLN
INTRAMUSCULAR | Status: DC | PRN
  Administered 2021-06-07: 20 mL via PERINEURAL

## 2021-06-07 MED ORDER — DEXAMETHASONE SODIUM PHOSPHATE 10 MG/ML IJ SOLN
INTRAMUSCULAR | Status: AC
  Filled 2021-06-07: qty 1

## 2021-06-07 MED ORDER — METHOCARBAMOL 500 MG PO TABS: 500.0000 mg | ORAL_TABLET | Freq: Three times a day (TID) | ORAL | 1 refills | Status: AC | PRN

## 2021-06-07 MED ORDER — PROPOFOL 10 MG/ML IV BOLUS
INTRAVENOUS | Status: AC
  Filled 2021-06-07: qty 20

## 2021-06-07 MED ORDER — PROPOFOL 10 MG/ML IV BOLUS
INTRAVENOUS | Status: DC | PRN
  Administered 2021-06-07: 200 mg via INTRAVENOUS

## 2021-06-07 MED ORDER — ONDANSETRON HCL 4 MG/2ML IJ SOLN
INTRAMUSCULAR | Status: AC
  Filled 2021-06-07: qty 2

## 2021-06-07 MED ORDER — MIDAZOLAM HCL 2 MG/2ML IJ SOLN
INTRAMUSCULAR | Status: AC
  Filled 2021-06-07: qty 2

## 2021-06-07 MED ORDER — LIDOCAINE 2% (20 MG/ML) 5 ML SYRINGE
INTRAMUSCULAR | Status: AC
  Filled 2021-06-07: qty 5

## 2021-06-07 MED ORDER — FENTANYL CITRATE (PF) 100 MCG/2ML IJ SOLN
INTRAMUSCULAR | Status: AC
  Filled 2021-06-07: qty 2

## 2021-06-07 MED ORDER — ONDANSETRON HCL 4 MG/2ML IJ SOLN: 4.0000 mg | Freq: Once | INTRAMUSCULAR | Status: DC | PRN

## 2021-06-07 MED ORDER — ACETAMINOPHEN 500 MG PO TABS
ORAL_TABLET | ORAL | Status: AC
  Filled 2021-06-07: qty 2

## 2021-06-07 MED ORDER — VANCOMYCIN HCL 1000 MG IV SOLR
INTRAVENOUS | Status: DC | PRN
  Administered 2021-06-07: 1000 mg via TOPICAL

## 2021-06-07 MED ORDER — FENTANYL CITRATE (PF) 100 MCG/2ML IJ SOLN
100.0000 ug | Freq: Once | INTRAMUSCULAR | Status: AC
  Administered 2021-06-07: 50 ug via INTRAVENOUS

## 2021-06-07 SURGICAL SUPPLY — 69 items
ANCH SUT 5 FBRTK 2.6 KNTLS SLF (Anchor) ×2 IMPLANT
ANCHOR SUT FBRTK 2.6 SP #5 (Anchor) ×2 IMPLANT
APL PRP STRL LF DISP 70% ISPRP (MISCELLANEOUS) ×1
BLADE AVERAGE 25X9 (BLADE) ×2 IMPLANT
BLADE SHAVER BONE 5.0X13 (MISCELLANEOUS) ×2 IMPLANT
BLADE SURG 10 STRL SS (BLADE) ×2 IMPLANT
BLADE SURG 15 STRL LF DISP TIS (BLADE) ×1 IMPLANT
BLADE SURG 15 STRL SS (BLADE) ×2
BNDG ELASTIC 6X5.8 VLCR STR LF (GAUZE/BANDAGES/DRESSINGS) ×1 IMPLANT
BONE TUNNEL PLUG CANNULATED (MISCELLANEOUS) ×2 IMPLANT
BURR OVAL 8 FLU 4.0X13 (MISCELLANEOUS) IMPLANT
CHLORAPREP W/TINT 26 (MISCELLANEOUS) ×2 IMPLANT
COLLECTOR GRAFT TISSUE (SYSTAGENIX WOUND MANAGEMENT) ×2
COOLER ICEMAN CLASSIC (MISCELLANEOUS) ×2 IMPLANT
COVER BACK TABLE 60X90IN (DRAPES) ×2 IMPLANT
CUFF TOURN SGL QUICK 34 (TOURNIQUET CUFF) ×2
CUFF TRNQT CYL 34X4.125X (TOURNIQUET CUFF) IMPLANT
DISSECTOR 3.5MM X 13CM CVD (MISCELLANEOUS) IMPLANT
DISSECTOR 4.0MMX13CM CVD (MISCELLANEOUS) ×2 IMPLANT
DRAPE IMP U-DRAPE 54X76 (DRAPES) IMPLANT
DRAPE U-SHAPE 47X51 STRL (DRAPES) ×2 IMPLANT
DRAPE-T ARTHROSCOPY W/POUCH (DRAPES) ×2 IMPLANT
ELECT REM PT RETURN 9FT ADLT (ELECTROSURGICAL) ×2
ELECTRODE REM PT RTRN 9FT ADLT (ELECTROSURGICAL) ×1 IMPLANT
GAUZE SPONGE 4X4 12PLY STRL (GAUZE/BANDAGES/DRESSINGS) ×4 IMPLANT
GLOVE BIO SURGEON STRL SZ 6.5 (GLOVE) ×4 IMPLANT
GLOVE BIOGEL PI IND STRL 6.5 (GLOVE) ×1 IMPLANT
GLOVE BIOGEL PI IND STRL 8 (GLOVE) ×1 IMPLANT
GLOVE BIOGEL PI INDICATOR 6.5 (GLOVE) ×2
GLOVE BIOGEL PI INDICATOR 8 (GLOVE) ×1
GLOVE ECLIPSE 8.0 STRL XLNG CF (GLOVE) ×3 IMPLANT
GOWN STRL REUS W/ TWL LRG LVL3 (GOWN DISPOSABLE) ×2 IMPLANT
GOWN STRL REUS W/TWL LRG LVL3 (GOWN DISPOSABLE) ×4
GOWN STRL REUS W/TWL XL LVL3 (GOWN DISPOSABLE) ×2 IMPLANT
GRAFT ROPE FROZEN (Tissue) ×2 IMPLANT
GRAFT TISS ROPE SEMITEND 4-5.5 (Tissue) IMPLANT
IMMOBILIZER KNEE 22 UNIV (SOFTGOODS) IMPLANT
IMMOBILIZER KNEE 24 THIGH 36 (MISCELLANEOUS) IMPLANT
IMMOBILIZER KNEE 24 UNIV (MISCELLANEOUS)
IV NS IRRIG 3000ML ARTHROMATIC (IV SOLUTION) ×6 IMPLANT
KIT TRANSTIBIAL (DISPOSABLE) ×2 IMPLANT
KNIFE GRAFT ACL 10MM 5952 (MISCELLANEOUS) ×2 IMPLANT
KNIFE GRAFT ACL 9MM (MISCELLANEOUS) IMPLANT
MANIFOLD NEPTUNE II (INSTRUMENTS) ×2 IMPLANT
NS IRRIG 1000ML POUR BTL (IV SOLUTION) ×2 IMPLANT
PACK ARTHROSCOPY DSU (CUSTOM PROCEDURE TRAY) ×2 IMPLANT
PACK BASIN DAY SURGERY FS (CUSTOM PROCEDURE TRAY) ×2 IMPLANT
PAD COLD SHLDR WRAP-ON (PAD) ×2 IMPLANT
PENCIL SMOKE EVACUATOR (MISCELLANEOUS) ×1 IMPLANT
PORT APPOLLO RF 90DEGREE MULTI (SURGICAL WAND) ×1 IMPLANT
PORTAL SKID DEVICE (INSTRUMENTS) ×1 IMPLANT
SCREW SHEATHED INTERF 8X25MM (Screw) ×1 IMPLANT
SCREW SHEATHED INTERF 9X20MM (Screw) ×1 IMPLANT
SLEEVE SCD COMPRESS KNEE MED (STOCKING) ×1 IMPLANT
SPIKE FLUID TRANSFER (MISCELLANEOUS) IMPLANT
SPONGE T-LAP 4X18 ~~LOC~~+RFID (SPONGE) ×2 IMPLANT
STRIP CLOSURE SKIN 1/2X4 (GAUZE/BANDAGES/DRESSINGS) ×2 IMPLANT
SUT FIBERWIRE #2 38 T-5 BLUE (SUTURE) ×8
SUT MNCRL AB 4-0 PS2 18 (SUTURE) IMPLANT
SUT VIC AB 0 CT1 27 (SUTURE) ×2
SUT VIC AB 0 CT1 27XBRD ANBCTR (SUTURE) ×1 IMPLANT
SUT VIC AB 3-0 SH 27 (SUTURE) ×2
SUT VIC AB 3-0 SH 27X BRD (SUTURE) ×1 IMPLANT
SUTURE FIBERWR #2 38 T-5 BLUE (SUTURE) ×4 IMPLANT
TISSUE GRAFT COLLECTOR (SYSTAGENIX WOUND MANAGEMENT) IMPLANT
TOWEL GREEN STERILE FF (TOWEL DISPOSABLE) ×4 IMPLANT
TUBE CONNECTING 20X1/4 (TUBING) ×2 IMPLANT
TUBE SUCTION HIGH CAP CLEAR NV (SUCTIONS) ×2 IMPLANT
TUBING ARTHROSCOPY IRRIG 16FT (MISCELLANEOUS) ×2 IMPLANT

## 2021-06-07 NOTE — Interval H&P Note (Signed)
All questions answered, patient wants to proceed with procedure. ? ?

## 2021-06-07 NOTE — Discharge Instructions (Addendum)
Ramond Marrow MD, MPH ?Alfonse Alpers, PA-C ?[facility] ?[address], [address] ?[phone] (tel)   ?[phone] (fax) ? ? ?POST-OPERATIVE INSTRUCTIONS - ACL RECONSTRUCTION ? ?WOUND CARE ?You may remove the Operative Dressing on Post-Op Day #3 (72hrs after surgery).   ?Leave steri strips in place.   ?If you feel more comfortable with it you can leave all dressings in place till your 1 week follow-up appointment.   ?KEEP THE INCISIONS CLEAN AND DRY. ?An ACE wrap may be used to control swelling, do not wrap this too tight.  If the initial ACE wrap feels too tight or constricting you may loosen it. ?There may be a small amount of fluid/bleeding leaking at the surgical site.  ?This is normal; the knee is filled with fluid during the procedure and can leak for 24-48hrs after surgery.  ?You may change/reinforce the bandage as needed.  ?Use the Cryocuff, GameReady or Ice as often as possible for the first 3-4 days, then as needed for pain relief. Always keep a towel, ACE wrap or other barrier between the cooling unit and your skin.  ?You may shower on Post-Op Day #3. Gently pat the area dry. Do not soak the knee in water.  ?Do not go swimming in the pool or ocean until 4 weeks after surgery or when otherwise instructed. ? ?BRACE/AMBULATION ?Your leg will be placed in a brace post-operatively.  ?You will need to wear your brace at all times until we discuss it further.  ?It should be locked in full extension (0 degrees) if adjustable.   ?You will be instructed on further bracing after your first visit. ?Use crutches for comfort but you can put your full weight on the leg as tolerated. ? ?REGIONAL ANESTHESIA (NERVE BLOCKS) ?- The anesthesia team may have performed a nerve block for you if safe in the setting of your care.  This is a great tool used to minimize pain.  Typically the block may start wearing off overnight.  This can be a challenging period but please utilize your as needed pain  medications to try and manage this period and know it will be a brief transition as the nerve block wears completely  ? ?POST-OP MEDICATIONS- Multimodal approach to pain control ?In general your pain will be controlled with a combination of substances.  Prescriptions unless otherwise discussed are electronically sent to your pharmacy.  This is a carefully made plan we use to minimize narcotic use.  ?   ?Diclofenac - Anti-inflammatory medication taken on a scheduled basis ?Acetaminophen - Non-narcotic pain medicine taken on a scheduled basis  ?Oxycodone - This is a strong narcotic, to be used only on an ?as needed? basis for SEVERE pain. ?Aspirin 81mg  - This medicine is used to minimize the risk of blood clots after surgery. ?Zofran - take as needed for nausea ?Robaxin -this is a muscle relaxer, take as needed for muscle spasms ? ?FOLLOW-UP ?Please call the office to schedule a follow-up appointment for your incision check if you do not already have one, 7-10 days post-operatively. ?IF YOU HAVE ANY QUESTIONS, PLEASE FEEL FREE TO CALL OUR OFFICE. ? ?HELPFUL INFORMATION ? ?If you had a block, it will wear off between 8-24 hrs postop typically.  This is period when your pain may go from nearly zero to the pain you would have had post-op without the block.  This is an abrupt transition but nothing dangerous is happening.  You may take an extra dose of narcotic when  this happens. ? ?Keep your leg elevated to decrease swelling, which will then in turn decrease your pain. I would elevate the foot of your bed by putting a couple of couch pillows between your mattress and box spring. I would not keep pillow directly under your ankle. ? ?You must wear the brace locked while sleeping and ambulating until follow-up.  ? ?There will be MORE swelling on days 1-3 than there is on the day of surgery.  This also is normal. The swelling will decrease with the anti-inflammatory medication, ice and keeping it elevated. The swelling will  make it more difficult to bend your knee. As the swelling goes down your motion will become easier ? ?You may develop swelling and bruising that extends from your knee down to your calf and perhaps even to your foot over the next week. Do not be alarmed. This too is normal, and it is due to gravity ? ?There may be some numbness adjacent to the incision site. This may last for 6-12 months or longer in some patients and is expected. ? ?You may return to sedentary work/school in the next couple of days when you feel up to it. You will need to keep your leg elevated as much as possible  ? ?You should wean off your narcotic medicines as soon as you are able.  Most patients will be off or using minimal narcotics before their first postop appointment.  ? ?We suggest you use the pain medication the first night prior to going to bed, in order to ease any pain when the anesthesia wears off. You should avoid taking pain medications on an empty stomach as it will make you nauseous. ? ?Do not drink alcoholic beverages or take illicit drugs when taking pain medications. ? ?It is against the law to drive while taking narcotics. You cannot drive if your Right leg is in brace locked in extension. ? ?Pain medication may make you constipated.  Below are a few solutions to try in this order: ?Decrease the amount of pain medication if you aren?t having pain. ?Drink lots of decaffeinated fluids. ?Drink prune juice and/or each dried prunes ? ?If the first 3 don?t work start with additional solutions ?Take Colace - an over-the-counter stool softener ?Take Senokot - an over-the-counter laxative ?Take Miralax - a stronger over-the-counter laxative ? ?For more information including helpful videos and documents visit our website:  ? ?https://www.[facility].com/patient-information.html ? ? ? ?Post Anesthesia Home Care Instructions ? ?Activity: ?Get plenty of rest for the remainder of the day. A responsible individual must stay with you for 24  hours following the procedure.  ?For the next 24 hours, DO NOT: ?-Drive a car ?-Advertising copywriter ?-Drink alcoholic beverages ?-Take any medication unless instructed by your physician ?-Make any legal decisions or sign important papers. ? ?Meals: ?Start with liquid foods such as gelatin or soup. Progress to regular foods as tolerated. Avoid greasy, spicy, heavy foods. If nausea and/or vomiting occur, drink only clear liquids until the nausea and/or vomiting subsides. Call your physician if vomiting continues. ? ?Special Instructions/Symptoms: ?Your throat may feel dry or sore from the anesthesia or the breathing tube placed in your throat during surgery. If this causes discomfort, gargle with warm salt water. The discomfort should disappear within 24 hours. ? ?If you had a scopolamine patch placed behind your ear for the management of post- operative nausea and/or vomiting: ? ?1. The medication in the patch is effective for 72 hours, after which it should be removed.  Wrap patch in a tissue and discard in the trash. Wash hands thoroughly with soap and water. ?2. You may remove the patch earlier than 72 hours if you experience unpleasant side effects which may include dry mouth, dizziness or visual disturbances. ?3. Avoid touching the patch. Wash your hands with soap and water after contact with the patch. ?    ? ? ?Regional Anesthesia Blocks ? ?1. Numbness or the inability to move the "blocked" extremity may last from 3-48 hours after placement. The length of time depends on the medication injected and your individual response to the medication. If the numbness is not going away after 48 hours, call your surgeon. ? ?2. The extremity that is blocked will need to be protected until the numbness is gone and the  Strength has returned. Because you cannot feel it, you will need to take extra care to avoid injury. Because it may be weak, you may have difficulty moving it or using it. You may not know what position it is  in without looking at it while the block is in effect. ? ?3. For blocks in the legs and feet, returning to weight bearing and walking needs to be done carefully. You will need to wait until the numbness is ent

## 2021-06-07 NOTE — Anesthesia Preprocedure Evaluation (Addendum)
Anesthesia Evaluation  ?Patient identified by MRN, date of birth, ID band ?Patient awake ? ? ? ?Reviewed: ?Allergy & Precautions, NPO status , Patient's Chart, lab work & pertinent test results ? ?History of Anesthesia Complications ?Negative for: history of anesthetic complications ? ?Airway ?Mallampati: I ? ?TM Distance: >3 FB ?Neck ROM: Full ? ? ? Dental ? ?(+) Dental Advisory Given, Teeth Intact ?  ?Pulmonary ?neg pulmonary ROS,  ?  ?Pulmonary exam normal ? ? ? ? ? ? ? Cardiovascular ?negative cardio ROS ?Normal cardiovascular exam ? ? ?  ?Neuro/Psych ?negative neurological ROS ? negative psych ROS  ? GI/Hepatic ?Neg liver ROS, GERD  Controlled,  ?Endo/Other  ?negative endocrine ROS ? Renal/GU ?negative Renal ROS  ? ?  ?Musculoskeletal ?negative musculoskeletal ROS ?(+)  ? Abdominal ?  ?Peds ? Hematology ?negative hematology ROS ?(+)   ?Anesthesia Other Findings ? ? Reproductive/Obstetrics ? ?  ? ? ? ? ? ? ? ? ? ? ? ? ? ?  ?  ? ? ? ? ? ? ? ?Anesthesia Physical ?Anesthesia Plan ? ?ASA: 1 ? ?Anesthesia Plan: General  ? ?Post-op Pain Management: Regional block*, Tylenol PO (pre-op)* and Celebrex PO (pre-op)*  ? ?Induction: Intravenous ? ?PONV Risk Score and Plan: 3 and Treatment may vary due to age or medical condition, Ondansetron, Dexamethasone, Midazolam and Scopolamine patch - Pre-op ? ?Airway Management Planned: LMA ? ?Additional Equipment: None ? ?Intra-op Plan:  ? ?Post-operative Plan: Extubation in OR ? ?Informed Consent: I have reviewed the patients History and Physical, chart, labs and discussed the procedure including the risks, benefits and alternatives for the proposed anesthesia with the patient or authorized representative who has indicated his/her understanding and acceptance.  ? ? ? ?Dental advisory given ? ?Plan Discussed with: CRNA and Anesthesiologist ? ?Anesthesia Plan Comments:   ? ? ? ? ? ?Anesthesia Quick Evaluation ? ?

## 2021-06-07 NOTE — Op Note (Addendum)
Orthopaedic Surgery Operative Note (CSN: [id number]) ? ?Carmen Jackson  12-06-2000 ?Date of Surgery: 06/07/2021 ? ? ?Diagnoses:  ?Left ACL deficient knee ? ?Procedure: ?Left revision ACL reconstruction with BTB autograft ?Left anterior lateral ligament reconstruction ?Left lateral partial lateral meniscectomy ?  ?Operative Finding ?Grossly unstable knee with a 2+ pivot shift and Lachman.  Dial testing was normal.  10 degrees of hyperextension.  Bone tunnels of grafted and well enough to perform relatively standard implants.  We had a robust 10 mm graft with good fixation.  Anterolateral ligament onlay was routine.  Good stability at the end of the case.  We will follow standard BTB protocol. ? ?The remainder the patient's knee was relatively pristine.  She did have a free edge tear of the lateral meniscus that was relatively complex.  We debrided back. ? ?Successful completion of the planned procedure.   ? ?Post-operative plan: The patient will be weightbearing to tolerance in knee brace locked in extension.  The patient will be discharged home.  DVT prophylaxis Aspirin 81 mg twice daily for 6 weeks.  Pain control with PRN pain medication preferring oral medicines.  Follow up plan will be scheduled in approximately 7 days for incision check and XR. ? ?Post-Op Diagnosis: Same ?Surgeons:Primary: Bjorn Pippin, MD ?Assistants:Caroline McBane PA-C ?Location: [facility] OR ROOM 1 ?Anesthesia: General with adductor canal ?Antibiotics: Ancef 2 g with local vancomycin powder 1 g at the surgical site ?Tourniquet time:  ?Total Tourniquet Time Documented: ?Thigh (Left) - 82 minutes ?Total: Thigh (Left) - 82 minutes ? ?Estimated Blood Loss: Minimal ?Complications: None ?Specimens: None ?Implants: ?Implant Name Type Inv. Item Serial No. Manufacturer Lot No. LRB No. Used Action  ?GRAFT ROPE FROZEN - [id number] Tissue GRAFT ROPE FROZEN [id number] [facility] [id number] Left 1 Implanted  ?ANCHOR SUT FBRTK 2.6 SP #5 - [id number]  Anchor ANCHOR SUT FBRTK 2.6 SP #5  [employer] [id number] Left 1 Implanted  ?ANCHOR SUT FBRTK 2.6 SP #5 - [id number] Anchor ANCHOR SUT FBRTK 2.6 SP #5  [employer] [id number] Left 1 Implanted  ?SCREW SHEATHED INTERF 8X25MM - [id number] Screw SCREW SHEATHED INTERF 8X25MM [id number] [employer] [id number] Left 1 Implanted  ?SCREW SHEATHED INTERF 9X20MM - [id number] Screw SCREW SHEATHED INTERF 9X20MM  [employer] [id number] Left 1 Implanted  ? ? ?Indications for Surgery:   ?Carmen Jackson is a 21 y.o. female with ACL deficient knee after previous bone grafting.  Benefits and risks of operative and nonoperative management were discussed prior to surgery with patient/guardian(s) and informed consent form was completed.  Specific risks including infection, need for additional surgery, rerupture, stiffness amongst others ? ? ?Procedure:   ?The patient was identified properly. Informed consent was obtained and the surgical site was marked. The patient was taken up to suite where general anesthesia was induced. The patient was placed in the supine position with a post against the surgical leg and a nonsterile tourniquet applied. The surgical leg was then prepped and draped usual sterile fashion.  A standard surgical timeout was performed.  2 standard anterior portals were made and diagnostic arthroscopy performed. Please note the findings as noted above. ? ?Performed a partial lateral meniscectomy with a shaver and a basket back to a stable base.  About 15% total meniscal volume resected. ? ?We began by making an incision along the lateral third of the patellar tendon in line with the tendon itself starting at the level of the distal pole of the patella ending 3 cm distal to the insertion on  the tubercle.  We used the lateral third to be in line with the patient's lateral portal and due to the patient's previous incision from her hamstring harvest which was too far medial to appropriately harvest the patellar tendon.  We carried the  incision down sharply achieving hemostasis 3 progressed identifying the tissue plane of the peritenon. The created skin flaps medially and laterally taking care to avoid damage to the superficial skin. This point the peritenon was incised sharply in line with the tendon and again flaps are created exposing the medial and lateral borders of the tendon. We then took care to ensure that there is appropriate visualization for forearm harvest within our incision using a mobile window technique. ? ?We then used a double bladed scalpel incised the tendon longitudinally 10 mm wide. This incision within the tendon was carried proximal and distally onto the tubercle and proximal wound patella to create a 25 mm bone block from patella and a 27 mm bone block from the tibia.  We made our longitudinal cuts with a saw taking care to not go deeper than 10 mm and rather than make a transverse patella cut we made a bullet style tapered cut at the proximal aspect of the patella to avoid the risk for transverse patella fracture.  The harvest went without issue and graft was taken to the back table.   ? ?This point we closed the defect in the patellar tendon after identifying that there was appropriate medial and lateral tendon still intact.  ? ?We began arthroscopy and made our lateral and medial portals within our incision on each side of the tendon. Fat pad was resected and diagnostic arthroscopy performed with the findings listed above.  ? ? Once the remnant scar in the intercondylar notch was removed and we obtained appropriate visualization by performing a small notchplasty and confirmed that we had indeed identify the over-the-top position. We made small marks at the location of the aperture of the tibial and femoral tunnels and double checked our location prior to drilling. ? ?We did note that the previous tunnel position had healed and well from its bone grafting.  We felt that it was appropriate to proceed with aperture-based  metal screw fixation. ? ?We began with a femoral tunnel.  We had taken care to make a far medial and low anteromedial portal with spinal needle localization to ensure that we can get appropriate position on the lateral wall of the notch from an anterior medial portal drilling technique.  We used a 7 mm offset guide with the knee at 90 degrees of flexion to mark in the position of the old ACL stump.  We then switched our camera to the medial portal and checked that our position was appropriate compared to the back wall.  At that point we hyperflexed the knee and used the 7 mm offset guide again primarily as a sheath to freehand place our wire at the aforementioned mark taking care to exit anterior to the mid femoral line to avoid posterior wall blowout.  Once the wire was advanced through the skin we clamped it and then placed a 10 mm acorn style reamer by hand ensuring that we did not interfere with the medial femoral condyle.  Once it entered the notch we are able to connect it to a reamer and ream to 30 mm of tunnel depth. ? ?We used an [employer] GraftNet device to harvest with a shaver any of the bony debris and cleared bony debris from  the tunnel to ensure that we had an easy pass.  We again checked from the medial portal to ensure that we only had a 2 mm posterior wall and appropriate position of our tunnel at the 2:30 position. ? ?At this point we advanced our Beath pin and shuttled a #2 FiberWire for eventual graft passage. ? ?We turned our attention to the tibial tunnel.  We used the patient's previous hamstring harvest incision and freehand drilled the tibial tunnel obtaining appropriate position in reference to the anterior horn of the lateral meniscus as well as the previous ACL footprint.  We then drilled with a 10 mm reamer and then dilated to ensure that we compressed the bone around the tunnel. ? ?We used the scope to check the tunnel walls for both the tibial and femoral tunnel and were happy with the  overall bone fill ? ?We finally checked our tunnel position and apertures once more and were happy were these. ? ?We turned our attention to the anterior lateral ligament.  We found our tibial insertion th

## 2021-06-07 NOTE — Anesthesia Procedure Notes (Signed)
Anesthesia Regional Block: Adductor canal block  ? ?Pre-Anesthetic Checklist: , timeout performed,  Correct Patient, Correct Site, Correct Laterality,  Correct Procedure, Correct Position, site marked,  Risks and benefits discussed,  Surgical consent,  Pre-op evaluation,  At surgeon's request and post-op pain management ? ?Laterality: Left ? ?Prep: chloraprep     ?  ?Needles:  ?Injection technique: Single-shot ? ?Needle Type: Echogenic Needle   ? ? ?Needle Length: 10cm  ?Needle Gauge: 21  ? ? ? ?Additional Needles: ? ? ?Narrative:  ?Start time: 06/07/2021 10:04 AM ?End time: 06/07/2021 10:07 AM ?Injection made incrementally with aspirations every 5 mL. ? ?Performed by: Personally  ?Anesthesiologist: Beryle Lathe, MD ? ?Additional Notes: ?No pain on injection. No increased resistance to injection. Injection made in 5cc increments. Good needle visualization. Patient tolerated the procedure well. ? ? ? ? ?

## 2021-06-07 NOTE — Hospital Course (Signed)
Muscle relaxer postop ?

## 2021-06-07 NOTE — Progress Notes (Signed)
Assisted Dr. Brock with left, adductor canal, ultrasound guided block. Side rails up, monitors on throughout procedure. See vital signs in flow sheet. Tolerated Procedure well. 

## 2021-06-07 NOTE — Transfer of Care (Signed)
Immediate Anesthesia Transfer of Care Note ? ?Patient: Carmen Jackson ? ?Procedure(s) Performed: ANTERIOR CRUCIATE LIGAMENT (ACL) REPAIR (Left: Knee) ?KNEE RECONSTRUCTION (Left: Knee) ? ?Patient Location: PACU ? ?Anesthesia Type:General and Regional ? ?Level of Consciousness: drowsy ? ?Airway & Oxygen Therapy: Patient Spontanous Breathing and Patient connected to face mask oxygen ? ?Post-op Assessment: Report given to RN and Post -op Vital signs reviewed and stable ? ?Post vital signs: Reviewed and stable ? ?Last Vitals:  ?Vitals Value Taken Time  ?BP 130/95 06/07/21 1245  ?Temp    ?Pulse 113 06/07/21 1245  ?Resp 20 06/07/21 1245  ?SpO2 100 % 06/07/21 1245  ?Vitals shown include unvalidated device data. ? ?Last Pain:  ?Vitals:  ? 06/07/21 1245  ?TempSrc:   ?PainSc: 10-Worst pain ever  ?   ? ?Patients Stated Pain Goal: 8 (06/07/21 0751) ? ?Complications: No notable events documented. ?

## 2021-06-07 NOTE — Anesthesia Procedure Notes (Signed)
Procedure Name: LMA Insertion ?Date/Time: 06/07/2021 10:55 AM ?Performed by: Ezequiel Kayser, CRNA ?Pre-anesthesia Checklist: Patient identified, Emergency Drugs available, Suction available and Patient being monitored ?Patient Re-evaluated:Patient Re-evaluated prior to induction ?Oxygen Delivery Method: Circle System Utilized ?Preoxygenation: Pre-oxygenation with 100% oxygen ?Induction Type: IV induction ?Ventilation: Mask ventilation without difficulty ?LMA: LMA inserted ?LMA Size: 4.0 ?Number of attempts: 1 ?Airway Equipment and Method: Bite block ?Placement Confirmation: positive ETCO2 ?Tube secured with: Tape ?Dental Injury: Teeth and Oropharynx as per pre-operative assessment  ? ? ? ? ?

## 2021-06-07 NOTE — Anesthesia Postprocedure Evaluation (Signed)
Anesthesia Post Note ? ?Patient: Carmen Jackson ? ?Procedure(s) Performed: ANTERIOR CRUCIATE LIGAMENT (ACL) REPAIR (Left: Knee) ?KNEE RECONSTRUCTION (Left: Knee) ? ?  ? ?Patient location during evaluation: PACU ?Anesthesia Type: General and Regional ?Level of consciousness: awake and alert ?Pain management: pain level controlled ?Vital Signs Assessment: post-procedure vital signs reviewed and stable ?Respiratory status: spontaneous breathing, nonlabored ventilation, respiratory function stable and patient connected to nasal cannula oxygen ?Cardiovascular status: blood pressure returned to baseline and stable ?Postop Assessment: no apparent nausea or vomiting ?Anesthetic complications: no ? ? ?No notable events documented. ? ?Last Vitals:  ?Vitals:  ? 06/07/21 1345 06/07/21 1400  ?BP: 117/72 113/73  ?Pulse: 74 78  ?Resp: 10 13  ?Temp:    ?SpO2: 99% 100%  ?  ?Last Pain:  ?Vitals:  ? 06/07/21 1400  ?TempSrc:   ?PainSc: 0-No pain  ? ? ?  ?  ?  ?  ?  ?  ? ?Frederich Montilla L Artemisa Sladek ? ? ? ? ?

## 2021-06-11 ENCOUNTER — Encounter (HOSPITAL_BASED_OUTPATIENT_CLINIC_OR_DEPARTMENT_OTHER): Payer: Self-pay | Admitting: Orthopaedic Surgery

## 2021-06-26 ENCOUNTER — Ambulatory Visit: Payer: Medicaid Other | Attending: Orthopaedic Surgery

## 2021-06-26 DIAGNOSIS — R262 Difficulty in walking, not elsewhere classified: Secondary | ICD-10-CM | POA: Diagnosis present

## 2021-06-26 DIAGNOSIS — M6281 Muscle weakness (generalized): Secondary | ICD-10-CM | POA: Insufficient documentation

## 2021-06-26 DIAGNOSIS — M25662 Stiffness of left knee, not elsewhere classified: Secondary | ICD-10-CM | POA: Diagnosis present

## 2021-06-26 NOTE — Patient Instructions (Signed)
?  Access Code: [email or web address] ?URL: [email or web address] ?Date: 06/26/2021 ?Prepared by: Janna Arch ? ?Exercises ?- Supine Active Straight Leg Raise  - 1 x daily - 7 x weekly - 2 sets - 10 reps - 5 hold ?- Supine Quad Set  - 1 x daily - 7 x weekly - 2 sets - 10 reps - 5 hold ?- Supine Heel Slide  - 1 x daily - 7 x weekly - 2 sets - 10 reps - 5 hold ?- Seated Ankle Alphabet  - 1 x daily - 7 x weekly - 2 sets - 10 reps - 5 hold ?- Seated Gluteal Sets  - 1 x daily - 7 x weekly - 2 sets - 10 reps - 5 hold ? ? ?

## 2021-06-26 NOTE — Therapy (Signed)
[place] ?[facility] ?[address] ?[place], [place], [address] ?Phone: [phone]   Fax:  [phone] ? ?Physical Therapy Evaluation ? ?Patient Details  ?Name: Carmen Jackson ?MRN: [id number] ?Date of Birth: 12/12/2000 ?Referring Provider (PT): Ramond Marrow MD ? ? ?Encounter Date: 06/26/2021 ? ? PT End of Session - 06/26/21 1248   ? ? Visit Number 1   ? Number of Visits 16   ? Date for PT Re-Evaluation 08/21/21   ? Authorization Type 1/10 eval 06/26/21   ? PT Start Time 1259   ? PT Stop Time 1344   ? PT Time Calculation (min) 45 min   ? Activity Tolerance Patient tolerated treatment well   ? Behavior During Therapy [facility] for tasks assessed/performed   ? ?  ?  ? ?  ? ? ?Past Medical History:  ?Diagnosis Date  ? GERD (gastroesophageal reflux disease)   ? ? ?Past Surgical History:  ?Procedure Laterality Date  ? ALLOGRAFT APPLICATION Left 05/25/2020  ? Procedure: ALLOGRAFT APPLICATION TIBIA AND FEMUR;  Surgeon: Bjorn Pippin, MD;  Location: [facility];  Service: Orthopedics;  Laterality: Left;  ? ANTERIOR CRUCIATE LIGAMENT REPAIR Left 2018  ? ANTERIOR CRUCIATE LIGAMENT REPAIR Left 06/07/2021  ? Procedure: ANTERIOR CRUCIATE LIGAMENT (ACL) REPAIR;  Surgeon: Bjorn Pippin, MD;  Location: [facility];  Service: Orthopedics;  Laterality: Left;  ? APPENDECTOMY    ? FRACTURE SURGERY    ? KNEE ARTHROSCOPY WITH MEDIAL MENISECTOMY Left 05/25/2020  ? Procedure: KNEE ARTHROSCOPY WITH MEDIAL MENISECTOMY;  Surgeon: Bjorn Pippin, MD;  Location: [facility];  Service: Orthopedics;  Laterality: Left;  ? KNEE RECONSTRUCTION Left 06/07/2021  ? Procedure: KNEE RECONSTRUCTION;  Surgeon: Bjorn Pippin, MD;  Location: [facility];  Service: Orthopedics;  Laterality: Left;  ? ? ?There were no vitals filed for this visit. ? ? ? Subjective Assessment - 06/26/21 1304   ? ? Subjective Patient  presents to physical therapy s/p L ACL surgery on 06/07/21.    ? Pertinent History Patient underwent L ACL surgery on 06/07/21. Has unlocked brace to 30 degrees. Will meet with him in 3 weeks  She had an injury in June of 2018 playing basketball and track. She had an acute trauma December 2018 and was found to have a meniscus tear and ACL tear. She had an allograft or allograft augmented autograft hamstring combination performed. She recovered and and two instability events one in 2020 and another in 2023. 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   ? Limitations Standing;Walking;Other (comment)   ? How long can you sit comfortably? n/a   ? How long can you stand comfortably? n/a   ? How long can you walk comfortably? can walk dog.   ? Patient Stated Goals return to PLOF   ? Currently in Pain? No/denies   ? ?  ?  ? ?  ? ? ? ? ? ? [facility] PT Assessment - 06/26/21 0001   ? ?  ? Assessment  ? Medical Diagnosis L ACL   ? Referring Provider (PT) Ramond Marrow MD   ? Onset Date/Surgical Date 06/07/21   ? Next MD Visit 2 weeks   ? Prior Therapy yes for past ACL replacement   ?  ? Precautions  ? Precautions Knee   ACL  ? Required Braces or Orthoses Knee Immobilizer - Left   ?  ? Restrictions  ? Weight Bearing  Restrictions Yes   ? LLE Weight Bearing Weight bearing as tolerated   ?  ? Balance Screen  ? Has the patient had a decrease in activity level because of a fear of falling?  Yes   ? Is the patient reluctant to leave their home because of a fear of falling?  No   ?  ? Home Environment  ? Living Environment Private residence   ? Living Arrangements Alone   ? Type of Home Apartment   ? Home Access Stairs to enter   ?  ? Prior Function  ? Level of Independence Independent   ? Vocation Student   ? Leisure cheerleader   ?  ? Observation/Other Assessments  ? Focus on Therapeutic Outcomes (FOTO)  63   ? ?  ?  ? ?  ? ? ? PAIN: ?Worst pain: 9/10  ?Least pain: 0/10  ?Current pain: 0/10  ? ?POSTURE: ?Slight weight shift onto R hip  ? ?PROM/AROM: ? ?AROM BLE:  ? ?Knee flexion: L 111 R  145 ?Knee extension:  L -3 R neutral  ? ?Ankle: ?WFL ? ?Hip  ?WFL ? ? ?Accessory Motions:  ?Patella mobilizations: hypomobile with additional fluid noted.  ? ?STRENGTH:  Graded on a 0-5 scale ?Muscle Group Left Right  ?Hip Flex */5 5/5  ?Hip Abd */5 5/5  ?Hip Add */5 5/5  ?Hip Ext */5 5/5  ?Hip IR/ER */5 5/5  ?Knee Flex */5 5/5  ?Knee Ext */5 5/5  ?Ankle DF 4/5 5/5  ?Ankle PF 4/5 5/5  ?*unable to test due to protocol ? ?SENSATION:  ?BUE :  ?BLE :  ? ?NEUROLOGICAL SCREEN: (2+ unless otherwise noted.) N=normal  Ab=abnormal ?  ?Level Dermatome R L  ?C3 Anterior Neck ? N N  ?C4 Top of Shoulder N N  ?C5 Lateral Upper Arm ? N N  ?C6 Lateral Arm/ Thumb ? N N  ?C7 Middle Finger ? N N  ?C8 4th & 5th Finger N N  ?T1 Medial Arm N N  ?L2 Medial thigh/groin N N  ?L3 Lower thigh/med.knee N N  ?L4 Medial leg/lat thigh N Ab  ?L5 Lat. leg & dorsal foot N Ab  ?S1 post/lat foot/thigh/leg N N  ?S2 Post./med. thigh & leg N N  ?  ?SOMATOSENSORY:  ?Any N & T in extremities or weakness: reports : ?  ?      Sensation           Intact      Diminished         Absent  ?Light touch LEs Anterior shin region    ?                         ? ? ? ?FUNCTIONAL MOBILITY: ? ?Supine <>sit with increased time ? ?GAIT: ?Decreased weight shift onto LLE, Decreased knee flexion due to knee immobilizer.  ? ?OUTCOME MEASURES: ?TEST Outcome Interpretation  ?5 times sit<>stand 7 sec no hands >60 yo, >15 sec indicates increased risk for falls  ?10 meter walk test        6 seconds         m/s <1.0 m/s indicates increased risk for falls; limited community ambulator  ?6 minute walk test       1435         Feet 1000 feet is community ambulator  ?FOTO 63 73  ?LEFS 55/80   ? ? ? ?Access Code: [email or web address] ?URL: [email or web address] ?Date:  06/26/2021 ?Prepared by: Precious BardMarina Marisa Hage ? ?Exercises ?- Supine Active Straight Leg Raise  - 1 x daily - 7 x weekly - 2 sets - 10 reps - 5 hold ?- Supine Quad Set  - 1 x daily - 7 x weekly - 2 sets - 10 reps - 5 hold ?- Supine  Heel Slide  - 1 x daily - 7 x weekly - 2 sets - 10 reps - 5 hold ?- Seated Ankle Alphabet  - 1 x daily - 7 x weekly - 2 sets - 10 reps - 5 hold ?- Seated Gluteal Sets  - 1 x daily - 7 x weekly - 2 sets - 10 reps - 5 hold ? ? ? ? ?Patient is a pleasant 21 year old female s/p L ACL surgery 06/07/21. Patient does live alone with a flight of stairs to enter apartment. Patient is highly motivated for return to PLOF as she is very active at baseline. Patient's brace is unlocked to 30 degrees. She is able to perform 10 MWT and 5x STS within functional times with brace. Patient is educated on Hep and importance of compliance. Patient will benefit from skilled physical therapy to increase strength, ROM, and mobility to return to PLOF.  ? ? ? ?Objective measurements completed on examination: See above findings.  ? ? ? ? ? ? ? ? ? ? ? ? ? ? PT Education - 06/26/21 1517   ? ? Education Details goals, POC, HEP   ? Person(s) Educated Patient   ? Methods Explanation;Demonstration;Tactile cues;Verbal cues   ? Comprehension Verbalized understanding;Returned demonstration;Verbal cues required;Tactile cues required   ? ?  ?  ? ?  ? ? ? PT Short Term Goals - 06/26/21 1526   ? ?  ? PT SHORT TERM GOAL #1  ? Title Patient will be independent in home exercise program to improve strength/mobility for better functional independence with ADLs.   ? Baseline 5/2: HEP   ? ?  ?  ? ?  ? ? ? ? PT Long Term Goals - 06/26/21 1527   ? ?  ? PT LONG TERM GOAL #1  ? Title Patient will increase FOTO score to equal to or greater than 73%    to demonstrate statistically significant improvement in mobility and quality of life.   ? Baseline 5/2: 63%   ? Time 8   ? Period Weeks   ? Status New   ? Target Date 08/21/21   ?  ? PT LONG TERM GOAL #2  ? Title Patient will increase BLE gross strength to 4+/5 as to improve functional strength for independent gait, increased standing tolerance and increased ADL ability.   ? Baseline 5/2: limited by protocol   ? Time 8    ? Period Weeks   ? Status New   ? Target Date 08/21/21   ?  ? PT LONG TERM GOAL #3  ? Title Patient will increase lower extremity functional scale to >75/80 to demonstrate improved functional mobility a

## 2021-06-27 ENCOUNTER — Ambulatory Visit: Payer: Medicaid Other

## 2021-06-27 DIAGNOSIS — M6281 Muscle weakness (generalized): Secondary | ICD-10-CM

## 2021-06-27 DIAGNOSIS — M25662 Stiffness of left knee, not elsewhere classified: Secondary | ICD-10-CM | POA: Diagnosis not present

## 2021-06-27 DIAGNOSIS — R262 Difficulty in walking, not elsewhere classified: Secondary | ICD-10-CM

## 2021-06-27 NOTE — Therapy (Signed)
[place] [facility] ?[address] ?[place], [place], [address] ?Phone: [phone]   Fax:  [phone] ? ?Physical Therapy Treatment ? ?Patient Details  ?Name: Carmen Jackson ?MRN: [id number] ?Date of Birth: November 30, 2000 ?Referring Provider (PT): Ramond MarrowVarkey, Dax MD ? ? ?Encounter Date: 06/27/2021 ? ? PT End of Session - 06/27/21 [id number]   ? ? Visit Number 2   ? Number of Visits 16   ? Date for PT Re-Evaluation 08/21/21   ? Authorization Type 2/10 eval 06/26/21   ? PT Start Time 0801   ? PT Stop Time 0844   ? PT Time Calculation (min) 43 min   ? Activity Tolerance Patient tolerated treatment well   ? [facility]/performed   ? ?  ?  ? ?  ? ? ?Past Medical History:  ?Diagnosis Date  ? GERD (gastroesophageal reflux disease)   ? ? ?Past Surgical History:  ?Procedure Laterality Date  ? ALLOGRAFT APPLICATION Left 05/25/2020  ? Procedure: ALLOGRAFT APPLICATION TIBIA AND FEMUR;  Surgeon: Bjorn PippinVarkey, Dax T, MD;  Location: [facility];  Service: Orthopedics;  Laterality: Left;  ? ANTERIOR CRUCIATE LIGAMENT REPAIR Left 2018  ? ANTERIOR CRUCIATE LIGAMENT REPAIR Left 06/07/2021  ? Procedure: ANTERIOR CRUCIATE LIGAMENT (ACL) REPAIR;  Surgeon: Bjorn PippinVarkey, Dax T, MD;  Location: [facility];  Service: Orthopedics;  Laterality: Left;  ? APPENDECTOMY    ? FRACTURE SURGERY    ? KNEE ARTHROSCOPY WITH MEDIAL MENISECTOMY Left 05/25/2020  ? Procedure: KNEE ARTHROSCOPY WITH MEDIAL MENISECTOMY;  Surgeon: Bjorn PippinVarkey, Dax T, MD;  Location: [facility];  Service: Orthopedics;  Laterality: Left;  ? KNEE RECONSTRUCTION Left 06/07/2021  ? Procedure: KNEE RECONSTRUCTION;  Surgeon: Bjorn PippinVarkey, Dax T, MD;  Location: [facility];  Service: Orthopedics;  Laterality: Left;  ? ? ?There were no vitals filed for this visit. ? ? Subjective Assessment - 06/27/21 0841   ? ? Subjective Patient reports no pain, has been walking since yesterdays eval.   ?  Pertinent History Patient underwent L ACL surgery on 06/07/21. Has unlocked brace to 30 degrees. Will meet with him in 3 weeks  She had an injury in June of 2018 playing basketball and track. She had an acute trauma December 2018 and was found to have a meniscus tear and ACL tear. She had an allograft or allograft augmented autograft hamstring combination performed. She recovered and and two instability events one in 2020 and another in 2023. 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   ? Limitations Standing;Walking;Other (comment)   ? How long can you sit comfortably? n/a   ? How long can you stand comfortably? n/a   ? How long can you walk comfortably? can walk dog.   ? Patient Stated Goals return to PLOF   ? Currently in Pain? No/denies   ? ?  ?  ? ?  ? ? ? ? ? ? ?Manual: ?Knee flexion PROM 10x 10 second holds ?Patella mobilizations 10x 5 second mobilizations each direction ? ?therEx: ?contract relax knee flexion and extension pressing into PT shoulder 10x 5 second holds ?stationary bike ?quad sets 10x 5 second holds ?SLR 10x 5 second holds ?Mini squat with green pad under RLE 10x  ?LAQ 10x LLE ?RTB resisted ankle 4 way 10x each direction ? ? ? ? ?Pt educated throughout session about proper posture and technique with exercises. Improved exercise technique, movement at target joints, use of  target muscles after min to mod verbal, visual, tactile cues. ? ?Patient is able to tolerate progressive strengthening and Rom interventions well today. Will decrease frequency to 1x/week starting next week due to limited approve visits from insurance. Quad contraction is strong but does need continued focus as she is not able to maintain it for prolonged periods of time. . Patient will benefit from skilled physical therapy to increase strength, ROM, and mobility to return to PLOF ? ? ? ? ? ? ? ? ? ? ? ? ? ? ? ? ? ? PT Education - 06/27/21 0841   ? ? Education Details exercise technique, manual   ? Person(s) Educated  Patient   ? Methods Explanation;Demonstration;Tactile cues;Verbal cues   ? Comprehension Verbalized understanding;Returned demonstration;Verbal cues required;Tactile cues required   ? ?  ?  ? ?  ? ? ? PT Short Term Goals - 06/26/21 1526   ? ?  ? PT SHORT TERM GOAL #1  ? Title Patient will be independent in home exercise program to improve strength/mobility for better functional independence with ADLs.   ? Baseline 5/2: HEP   ? ?  ?  ? ?  ? ? ? ? PT Long Term Goals - 06/26/21 1527   ? ?  ? PT LONG TERM GOAL #1  ? Title Patient will increase FOTO score to equal to or greater than 73%    to demonstrate statistically significant improvement in mobility and quality of life.   ? Baseline 5/2: 63%   ? Time 8   ? Period Weeks   ? Status New   ? Target Date 08/21/21   ?  ? PT LONG TERM GOAL #2  ? Title Patient will increase BLE gross strength to 4+/5 as to improve functional strength for independent gait, increased standing tolerance and increased ADL ability.   ? Baseline 5/2: limited by protocol   ? Time 8   ? Period Weeks   ? Status New   ? Target Date 08/21/21   ?  ? PT LONG TERM GOAL #3  ? Title Patient will increase lower extremity functional scale to >75/80 to demonstrate improved functional mobility and increased tolerance with ADLs.   ? Baseline 5/2: 55/80   ? Time 8   ? Period Weeks   ? Status New   ? Target Date 09/18/21   ?  ? PT LONG TERM GOAL #4  ? Title Patient will have knee flexion and extension of >120 and neutral respectively to return to PLOF actively.   ? Baseline 5/2: flexion 111 extension -3   ? Time 8   ? Period Weeks   ? Status New   ? Target Date 08/21/21   ?  ? PT LONG TERM GOAL #5  ? Title Patient will return to running and gym workout program.   ? Baseline 5/2: unable to perform yet.   ? Time 8   ? Period Weeks   ? Status New   ? Target Date 08/21/21   ? ?  ?  ? ?  ? ? ? ? ? ? ? ? Plan - 06/27/21 0931   ? ? Clinical Impression Statement Patient is able to tolerate progressive strengthening and  Rom interventions well today. Will decrease frequency to 1x/week starting next week due to limited approve visits from insurance. Quad contraction is strong but does need continued focus as she is not able to maintain it for prolonged periods of time. . Patient will benefit from  skilled physical therapy to increase strength, ROM, and mobility to return to PLOF   ? Personal Factors and Comorbidities Comorbidity 2;Finances;Past/Current Experience   ? Comorbidities GERD, hx of ACL surgery   ? Examination-Activity Limitations Bathing;Bend;Carry;Dressing;Lift;Locomotion Level;Squat;Stairs;Stand   ? Examination-Participation Restrictions Cleaning;Community Activity;Laundry;Shop;Occupation;Volunteer;Yard Work;Other   ? Stability/Clinical Decision Making Stable/Uncomplicated   ? Rehab Potential Excellent   ? PT Frequency 2x / week   ? PT Duration 8 weeks   ? PT Treatment/Interventions ADLs/Self Care Home Management;Cryotherapy;Electrical Stimulation;Iontophoresis 4mg /ml Dexamethasone;Moist Heat;Traction;Ultrasound;Therapeutic exercise;Therapeutic activities;Functional mobility training;Stair training;Gait training;Balance training;Neuromuscular re-education;Cognitive remediation;Patient/family education;Compression bandaging;Manual techniques;Scar mobilization;Passive range of motion;Dry needling;Energy conservation;Visual/perceptual remediation/compensation   ? PT Next Visit Plan knee ROM, strengthening   ? PT Home Exercise Plan see above   ? Consulted and Agree with Plan of Care Patient   ? ?  ?  ? ?  ? ? ?Patient will benefit from skilled therapeutic intervention in order to improve the following deficits and impairments:  Abnormal gait, Decreased balance, Decreased endurance, Decreased mobility, Decreased range of motion, Difficulty walking, Decreased strength, Decreased scar mobility, Hypomobility, Increased edema, Impaired flexibility, Impaired sensation, Postural dysfunction, Pain ? ?Visit Diagnosis: ?Stiffness of  left knee, not elsewhere classified ? ?Muscle weakness (generalized) ? ?Difficulty in walking, not elsewhere classified ? ? ? ? ?Problem List ?Patient Active Problem List  ? Diagnosis Date Noted  ? Pharyngi

## 2021-07-03 ENCOUNTER — Ambulatory Visit: Payer: Medicaid Other

## 2021-07-03 DIAGNOSIS — M6281 Muscle weakness (generalized): Secondary | ICD-10-CM

## 2021-07-03 DIAGNOSIS — M25662 Stiffness of left knee, not elsewhere classified: Secondary | ICD-10-CM

## 2021-07-03 DIAGNOSIS — R262 Difficulty in walking, not elsewhere classified: Secondary | ICD-10-CM

## 2021-07-03 NOTE — Therapy (Signed)
[place] ?[facility] ?[address] ?[place], [place], [address] ?Phone: [phone]   Fax:  [phone] ? ?Physical Therapy Treatment ? ?Patient Details  ?Name: Carmen Jackson ?MRN: [id number] ?Date of Birth: Apr 25, 2000 ?Referring Provider (PT): Ramond Marrow MD ? ? ?Encounter Date: 07/03/2021 ? ? PT End of Session - 07/03/21 1643   ? ? Visit Number 3   ? Number of Visits 16   ? Date for PT Re-Evaluation 08/21/21   ? Authorization Type 3/10 eval 06/26/21   ? PT Start Time 1645   ? PT Stop Time 1729   ? PT Time Calculation (min) 44 min   ? Activity Tolerance Patient tolerated treatment well   ? Behavior During Therapy [facility] for tasks assessed/performed   ? ?  ?  ? ?  ? ? ?Past Medical History:  ?Diagnosis Date  ? GERD (gastroesophageal reflux disease)   ? ? ?Past Surgical History:  ?Procedure Laterality Date  ? ALLOGRAFT APPLICATION Left 05/25/2020  ? Procedure: ALLOGRAFT APPLICATION TIBIA AND FEMUR;  Surgeon: Bjorn Pippin, MD;  Location: [facility];  Service: Orthopedics;  Laterality: Left;  ? ANTERIOR CRUCIATE LIGAMENT REPAIR Left 2018  ? ANTERIOR CRUCIATE LIGAMENT REPAIR Left 06/07/2021  ? Procedure: ANTERIOR CRUCIATE LIGAMENT (ACL) REPAIR;  Surgeon: Bjorn Pippin, MD;  Location: [facility];  Service: Orthopedics;  Laterality: Left;  ? APPENDECTOMY    ? FRACTURE SURGERY    ? KNEE ARTHROSCOPY WITH MEDIAL MENISECTOMY Left 05/25/2020  ? Procedure: KNEE ARTHROSCOPY WITH MEDIAL MENISECTOMY;  Surgeon: Bjorn Pippin, MD;  Location: [facility];  Service: Orthopedics;  Laterality: Left;  ? KNEE RECONSTRUCTION Left 06/07/2021  ? Procedure: KNEE RECONSTRUCTION;  Surgeon: Bjorn Pippin, MD;  Location: [facility];  Service: Orthopedics;  Laterality: Left;  ? ? ?There were no vitals filed for this visit. ? ? Subjective Assessment - 07/03/21 1659   ? ? Subjective Patient reports compliance with HEP. Went to a car show over the  weekend.   ? Pertinent History Patient underwent L ACL surgery on 06/07/21. Has unlocked brace to 30 degrees. Will meet with him in 3 weeks  She had an injury in June of 2018 playing basketball and track. She had an acute trauma December 2018 and was found to have a meniscus tear and ACL tear. She had an allograft or allograft augmented autograft hamstring combination performed. She recovered and and two instability events one in 2020 and another in 2023. 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   ? Limitations Standing;Walking;Other (comment)   ? How long can you sit comfortably? n/a   ? How long can you stand comfortably? n/a   ? How long can you walk comfortably? can walk dog.   ? Patient Stated Goals return to PLOF   ? Currently in Pain? No/denies   ? ?  ?  ? ?  ? ? ? ? ? ? ?Manual: ?Knee flexion PROM 10x 10 second holds ?Patella mobilizations x5 minutes ? scar tissue massage x 5 minutes ?  ?therEx:all standing interventions with brace on ?contract relax knee flexion and extension pressing into PT shoulder 10x 5 second holds ?stationary bike: Nustep Lvl 0 LE only seat position 7; cue for SPM> 50; 4 minutes ?quad sets 10x 5 second holds ?Heel slides on slide board 15x 5 second holds ?SLR 10x ; x2 sets  ?Mini squat with green pad under RLE 10x  x2 sets in front of mirror  ?Weight shift onto LLE with RLE on dynadisc 30 seconds x 2 trials  ? ?  ?  ?Pt educated throughout session about proper posture and technique with exercises. Improved exercise technique, movement at target joints, use of target muscles after min to mod verbal, visual, tactile cues. ? ? ? ? ? ?Patient tolerates progressive flexion and extension without pain. Will see physician on Friday and will hopefully be allowed to progress to next stage. Patient is highly motivated for progression of care. She is educated on scar tissue massage and will perform as part of HEP. Patient will benefit from skilled physical therapy to increase strength,  ROM, and mobility to return to PLOF ? ? ? ? ? ? ? ? ? ? ? ? ? ? ? ? PT Education - 07/03/21 1642   ? ? Education Details exercise technique, body mechanics, manual   ? Person(s) Educated Patient   ? Methods Explanation;Demonstration;Tactile cues;Verbal cues   ? Comprehension Verbalized understanding;Returned demonstration;Verbal cues required;Tactile cues required   ? ?  ?  ? ?  ? ? ? PT Short Term Goals - 06/26/21 1526   ? ?  ? PT SHORT TERM GOAL #1  ? Title Patient will be independent in home exercise program to improve strength/mobility for better functional independence with ADLs.   ? Baseline 5/2: HEP   ? ?  ?  ? ?  ? ? ? ? PT Long Term Goals - 06/26/21 1527   ? ?  ? PT LONG TERM GOAL #1  ? Title Patient will increase FOTO score to equal to or greater than 73%    to demonstrate statistically significant improvement in mobility and quality of life.   ? Baseline 5/2: 63%   ? Time 8   ? Period Weeks   ? Status New   ? Target Date 08/21/21   ?  ? PT LONG TERM GOAL #2  ? Title Patient will increase BLE gross strength to 4+/5 as to improve functional strength for independent gait, increased standing tolerance and increased ADL ability.   ? Baseline 5/2: limited by protocol   ? Time 8   ? Period Weeks   ? Status New   ? Target Date 08/21/21   ?  ? PT LONG TERM GOAL #3  ? Title Patient will increase lower extremity functional scale to >75/80 to demonstrate improved functional mobility and increased tolerance with ADLs.   ? Baseline 5/2: 55/80   ? Time 8   ? Period Weeks   ? Status New   ? Target Date 09/18/21   ?  ? PT LONG TERM GOAL #4  ? Title Patient will have knee flexion and extension of >120 and neutral respectively to return to PLOF actively.   ? Baseline 5/2: flexion 111 extension -3   ? Time 8   ? Period Weeks   ? Status New   ? Target Date 08/21/21   ?  ? PT LONG TERM GOAL #5  ? Title Patient will return to running and gym workout program.   ? Baseline 5/2: unable to perform yet.   ? Time 8   ? Period Weeks    ? Status New   ? Target Date 08/21/21   ? ?  ?  ? ?  ? ? ? ? ? ? ? ? Plan - 07/04/21 0658   ? ? Clinical Impression Statement Patient tolerates progressive flexion and extension without pain. Will  see physician on Friday and will hopefully be allowed to progress to next stage. Patient is highly motivated for progression of care. She is educated on scar tissue massage and will perform as part of HEP. Patient will benefit from skilled physical therapy to increase strength, ROM, and mobility to return to PLOF   ? Personal Factors and Comorbidities Comorbidity 2;Finances;Past/Current Experience   ? Comorbidities GERD, hx of ACL surgery   ? Examination-Activity Limitations Bathing;Bend;Carry;Dressing;Lift;Locomotion Level;Squat;Stairs;Stand   ? Examination-Participation Restrictions Cleaning;Community Activity;Laundry;Shop;Occupation;Volunteer;Yard Work;Other   ? Stability/Clinical Decision Making Stable/Uncomplicated   ? Rehab Potential Excellent   ? PT Frequency 2x / week   ? PT Duration 8 weeks   ? PT Treatment/Interventions ADLs/Self Care Home Management;Cryotherapy;Electrical Stimulation;Iontophoresis 4mg /ml Dexamethasone;Moist Heat;Traction;Ultrasound;Therapeutic exercise;Therapeutic activities;Functional mobility training;Stair training;Gait training;Balance training;Neuromuscular re-education;Cognitive remediation;Patient/family education;Compression bandaging;Manual techniques;Scar mobilization;Passive range of motion;Dry needling;Energy conservation;Visual/perceptual remediation/compensation   ? PT Next Visit Plan knee ROM, strengthening   ? PT Home Exercise Plan see above   ? Consulted and Agree with Plan of Care Patient   ? ?  ?  ? ?  ? ? ?Patient will benefit from skilled therapeutic intervention in order to improve the following deficits and impairments:  Abnormal gait, Decreased balance, Decreased endurance, Decreased mobility, Decreased range of motion, Difficulty walking, Decreased strength, Decreased  scar mobility, Hypomobility, Increased edema, Impaired flexibility, Impaired sensation, Postural dysfunction, Pain ? ?Visit Diagnosis: ?Stiffness of left knee, not elsewhere classified ? ?Muscle weakness (ge

## 2021-07-12 ENCOUNTER — Ambulatory Visit: Payer: Medicaid Other

## 2021-07-12 DIAGNOSIS — M25662 Stiffness of left knee, not elsewhere classified: Secondary | ICD-10-CM

## 2021-07-12 DIAGNOSIS — M6281 Muscle weakness (generalized): Secondary | ICD-10-CM

## 2021-07-12 DIAGNOSIS — R262 Difficulty in walking, not elsewhere classified: Secondary | ICD-10-CM

## 2021-07-12 NOTE — Therapy (Signed)
[facility] MAIN [facility] SERVICES [address] [place], [place], [address] Phone: [phone]   Fax:  [phone]  Physical Therapy Treatment  Patient Details  Name: Carmen Jackson MRN: [id number] Date of Birth: [birth year] Referring Provider (PT): Ramond Marrow MD   Encounter Date: 07/12/2021   PT End of Session - 07/12/21 1133     Visit Number 4    Number of Visits 16    Date for PT Re-Evaluation 08/21/21    Authorization Type [place] Medicaid [employer] (Authorization 06/27/21-08/26/21) 27 visits anually    Authorization Time Period Cert period 07/30/01-5/46/56    Authorization - Visit Number 3    Authorization - Number of Visits 27    Progress Note Due on Visit 10    PT Start Time 1102    PT Stop Time 1147    PT Time Calculation (min) 45 min    Equipment Utilized During Treatment Gait belt    Activity Tolerance Patient tolerated treatment well;No increased pain    Behavior During Therapy WFL for tasks assessed/performed             Past Medical History:  Diagnosis Date   GERD (gastroesophageal reflux disease)     Past Surgical History:  Procedure Laterality Date   ALLOGRAFT APPLICATION Left 05/25/2020   Procedure: ALLOGRAFT APPLICATION TIBIA AND FEMUR;  Surgeon: Bjorn Pippin, MD;  Location: [facility];  Service: Orthopedics;  Laterality: Left;   ANTERIOR CRUCIATE LIGAMENT REPAIR Left 2018   ANTERIOR CRUCIATE LIGAMENT REPAIR Left 06/07/2021   Procedure: ANTERIOR CRUCIATE LIGAMENT (ACL) REPAIR;  Surgeon: Bjorn Pippin, MD;  Location: [facility];  Service: Orthopedics;  Laterality: Left;   APPENDECTOMY     FRACTURE SURGERY     KNEE ARTHROSCOPY WITH MEDIAL MENISECTOMY Left 05/25/2020   Procedure: KNEE ARTHROSCOPY WITH MEDIAL MENISECTOMY;  Surgeon: Bjorn Pippin, MD;  Location: [facility];  Service: Orthopedics;  Laterality: Left;   KNEE RECONSTRUCTION Left 06/07/2021   Procedure: KNEE RECONSTRUCTION;   Surgeon: Bjorn Pippin, MD;  Location: [facility];  Service: Orthopedics;  Laterality: Left;    There were no vitals filed for this visit.   Subjective Assessment - 07/12/21 1131     Subjective Pt doing well, still wokring on HEP. No tusing crutches anymore. Knee brace locked at 90 degrees.    Pertinent History Patient underwent L ACL surgery on 06/07/21. Has unlocked brace to 30 degrees. Will meet with him in 3 weeks  She had an injury in June of 2018 playing basketball and track. She had an acute trauma December 2018 and was found to have a meniscus tear and ACL tear. She had an allograft or allograft augmented autograft hamstring combination performed. She recovered and and two instability events one in 2020 and another in 2023. 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    Currently in Pain? No/denies            As of this date 5/18: Pt is WBAT LLE without device, still using a locking knee brace, currently permitted 0-90 degrees.  *Left knee flexion ROM: 3-136 degrees (07/12/21)    INTERVENTION THIS DATE: -well zone recumbent bike seat 13 x4 minutes, no resistance *Brace adjustment and realignment -STS from elevated suface 1x10, hands free, cues for symmetry   Supine:  -Patella femoral mobilization medial, lateral, cranial, caudal x10 each, Grade III *reviewed scar massage education from last session  -Quad  set into towel roll 1x15x3secH (cues to DC gluteal extension and focus on quads in isolation) -LLE SLR to 12" height 1x15x1secH  -Heel slides Left 1x15x3secH  *Left knee flexion ROM: 3-136 degrees -hooklying bridge 1x15x1secH knees set at 90 degrees for hamstrings activation  -LLE SLR to 12" height 1x15x1secH  -hooklying isometric clam against gait belt in neutral hip position (~6 inches between knees and feet)  -hooklying bridge 1x15x1secH knees set at 90 degrees for hamstrings activation  -standing Left hamstrings curl 1x10 @ 5lb AW *new today*    PT Education - 07/12/21 1140     Education Details form for exercises, brace use    Person(s) Educated Patient    Methods Explanation;Demonstration    Comprehension Verbalized understanding;Need further instruction              PT Short Term Goals - 07/12/21 1209       PT SHORT TERM GOAL #1   Title Patient will be independent in home exercise program to improve strength/mobility for better functional independence with ADLs.    Baseline 5/2: HEP    Time 4    Period Weeks    Status Achieved    Target Date 07/26/21               PT Long Term Goals - 07/12/21 1209       PT LONG TERM GOAL #1   Title Patient will increase FOTO score to equal to or greater than 73%    to demonstrate statistically significant improvement in mobility and quality of life.    Baseline 5/2: 63%    Time 8    Period Weeks    Status On-going    Target Date 08/21/21      PT LONG TERM GOAL #2   Title Patient will increase BLE gross strength to 4+/5 as to improve functional strength for independent gait, increased standing tolerance and increased ADL ability.    Baseline 5/2: limited by protocol    Time 8    Status On-going    Target Date 08/21/21      PT LONG TERM GOAL #3   Title Patient will increase lower extremity functional scale to >75/80 to demonstrate improved functional mobility and increased tolerance with ADLs.    Baseline 5/2: 55/80    Time 8    Period Weeks    Status New    Target Date 08/21/21      PT LONG TERM GOAL #4   Title Patient will have knee flexion and extension of >120 and neutral respectively to return to PLOF actively.    Baseline 5/2: flexion 111 extension -3; 5/18: 3-136 degrees (did not prop heel as to provoke hyperextension for measurement)    Time 8    Period Weeks    Status Achieved    Target Date 08/21/21      PT LONG TERM GOAL #5   Title Patient will return to running and gym workout program.    Baseline 5/2: unable to perform yet.    Time 16     Period Weeks    Status On-going    Target Date 11/01/21                   Plan - 07/12/21 1140     Clinical Impression Statement Continued with POC per surgical protocol. 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 Activation  excellent. Pain well managed. Brace readjusted. WIll conitnue with protocol. Excellent progress toward goals thusfar.    Personal Factors and Comorbidities Comorbidity 2;Finances;Past/Current Experience    Comorbidities GERD, hx of ACL surgery    Examination-Activity Limitations Bathing;Bend;Carry;Dressing;Lift;Locomotion Level;Squat;Stairs;Stand    Examination-Participation Restrictions Cleaning;Community Activity;Laundry;Shop;Occupation;Volunteer;Yard Work;Other    Stability/Clinical Decision Making Stable/Uncomplicated    Clinical Decision Making Jackson    Rehab Potential Excellent    PT Frequency 2x / week    PT Duration 8 weeks    PT Treatment/Interventions ADLs/Self Care Home Management;Cryotherapy;Electrical Stimulation;Iontophoresis 4mg /ml Dexamethasone;Moist Heat;Traction;Ultrasound;Therapeutic exercise;Therapeutic activities;Functional mobility training;Stair training;Gait training;Balance training;Neuromuscular re-education;Cognitive remediation;Patient/family education;Compression bandaging;Manual techniques;Scar mobilization;Passive range of motion;Dry needling;Energy conservation;Visual/perceptual remediation/compensation    Consulted and Agree with Plan of Care Patient             Patient will benefit from skilled therapeutic intervention in order to improve the following deficits and impairments:  Abnormal gait, Decreased balance, Decreased endurance, Decreased mobility, Decreased range of motion, Difficulty walking, Decreased strength, Decreased scar mobility, Hypomobility, Increased edema, Impaired flexibility, Impaired sensation,  Postural dysfunction, Pain  Visit Diagnosis: Stiffness of left knee, not elsewhere classified  Muscle weakness (generalized)  Difficulty in walking, not elsewhere classified     Problem List Patient Active Problem List   Diagnosis Date Noted   Pharyngitis 01/18/2020   Gastroesophageal reflux disease 01/18/2020   12:21 PM, 07/12/21 Rosamaria LintsAllan C Araminta Zorn, PT, DPT Physical Therapist - [facility]  Outpatient Physical Therapy- Main Campus [phone]     GarcenoBuccola,Caetano Oberhaus C, PT 07/12/2021, 12:13 PM  [facility] MAIN [facility] [address] [place], [place], [address] Phone: [phone]   Fax:  [phone]  Name: Jeanine LuzLayla Jackson MRN: [id number] Date of Birth: 2000-09-09

## 2021-07-17 ENCOUNTER — Ambulatory Visit: Payer: Medicaid Other

## 2021-07-17 DIAGNOSIS — M25662 Stiffness of left knee, not elsewhere classified: Secondary | ICD-10-CM

## 2021-07-17 DIAGNOSIS — M6281 Muscle weakness (generalized): Secondary | ICD-10-CM

## 2021-07-17 DIAGNOSIS — R262 Difficulty in walking, not elsewhere classified: Secondary | ICD-10-CM

## 2021-07-17 NOTE — Therapy (Signed)
[place] [facility] [facility] [address] [place], [place], [address] Phone: [phone]   Fax:  [phone]  Physical Therapy Treatment  Patient Details  Name: Carmen LuzLayla Jackson MRN: [id number] Date of Birth: 21-Jul-2000 Referring Provider (PT): Ramond MarrowVarkey, Dax MD   Encounter Date: 07/17/2021   PT End of Session - 07/17/21 0832     Visit Number 5    Number of Visits 16    Date for PT Re-Evaluation 08/21/21    Authorization Type [place] Medicaid [employer] (Authorization 06/27/21-08/26/21) 27 visits anually    Authorization Time Period Cert period 08/03/60-9/52/845/2/23-6/27/23    Authorization - Visit Number 4    Authorization - Number of Visits 27    Progress Note Due on Visit 10    PT Start Time 0801    PT Stop Time 0841    PT Time Calculation (min) 40 min    Equipment Utilized During Treatment Gait belt    Activity Tolerance Patient tolerated treatment well;No increased pain    Behavior During Therapy WFL for tasks assessed/performed             Past Medical History:  Diagnosis Date   GERD (gastroesophageal reflux disease)     Past Surgical History:  Procedure Laterality Date   ALLOGRAFT APPLICATION Left 05/25/2020   Procedure: ALLOGRAFT APPLICATION TIBIA AND FEMUR;  Surgeon: Bjorn PippinVarkey, Dax T, MD;  Location: [facility];  Service: Orthopedics;  Laterality: Left;   ANTERIOR CRUCIATE LIGAMENT REPAIR Left 2018   ANTERIOR CRUCIATE LIGAMENT REPAIR Left 06/07/2021   Procedure: ANTERIOR CRUCIATE LIGAMENT (ACL) REPAIR;  Surgeon: Bjorn PippinVarkey, Dax T, MD;  Location: [facility];  Service: Orthopedics;  Laterality: Left;   APPENDECTOMY     FRACTURE SURGERY     KNEE ARTHROSCOPY WITH MEDIAL MENISECTOMY Left 05/25/2020   Procedure: KNEE ARTHROSCOPY WITH MEDIAL MENISECTOMY;  Surgeon: Bjorn PippinVarkey, Dax T, MD;  Location: [facility];  Service: Orthopedics;  Laterality: Left;   KNEE RECONSTRUCTION Left 06/07/2021   Procedure: KNEE RECONSTRUCTION;   Surgeon: Bjorn PippinVarkey, Dax T, MD;  Location: [facility];  Service: Orthopedics;  Laterality: Left;    There were no vitals filed for this visit.   Subjective Assessment - 07/17/21 0831     Subjective Doing well, pain good, HEP ok, updates nil.    Pertinent History Patient underwent L ACL surgery on 06/07/21. Has unlocked brace to 30 degrees. Will meet with him in 3 weeks  She had an injury in June of 2018 playing basketball and track. She had an acute trauma December 2018 and was found to have a meniscus tear and ACL tear. She had an allograft or allograft augmented autograft hamstring combination performed. She recovered and and two instability events one in 2020 and another in 2023. 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    Currently in Pain? No/denies              Intervention:  -Recumbent bike wellZone Seat 13, level 3 x 5 minutes (brace in place to maintain 90 degree restriction)  -Chair squat STS 1x15, focus on symmetry  -standing double heel raise x20 narrow stance  -Chair squat STS 1x15, focus on symmetry  -standing left single leg heel riase 1x15 with BUE assist to attain full range  -Left SLS 10x10sec (no knee instability) -Standing LLE marching 1x15, standing LLE hamstrings curl x15 @ 3lb  -Standing LLE marching 1x15, standing LLE hamstrings curl x15 @ 3lb  Supine -LLE quad set into towel roll 15x3secH  -LLE Heel slides 1x15 -LLE SLR 12" x10  -hooklying bridge 1x15 (knees set to 90 degrees  -Left SAQ over blue bolster 1x15 (cues to avoid hyperextension)  -hooklying bridge 1x15 (knees set to 90 degrees  -Left SAQ over blue bolster 1x15          PT Education - 07/17/21 0833     Education Details precautions for knee    Person(s) Educated Patient    Methods Explanation;Demonstration    Comprehension Need further instruction              PT Short Term Goals - 07/12/21 1209       PT SHORT TERM GOAL #1   Title Patient will  be independent in home exercise program to improve strength/mobility for better functional independence with ADLs.    Baseline 5/2: HEP    Time 4    Period Weeks    Status Achieved    Target Date 07/26/21               PT Long Term Goals - 07/12/21 1209       PT LONG TERM GOAL #1   Title Patient will increase FOTO score to equal to or greater than 73%    to demonstrate statistically significant improvement in mobility and quality of life.    Baseline 5/2: 63%    Time 8    Period Weeks    Status On-going    Target Date 08/21/21      PT LONG TERM GOAL #2   Title Patient will increase BLE gross strength to 4+/5 as to improve functional strength for independent gait, increased standing tolerance and increased ADL ability.    Baseline 5/2: limited by protocol    Time 8    Status On-going    Target Date 08/21/21      PT LONG TERM GOAL #3   Title Patient will increase lower extremity functional scale to >75/80 to demonstrate improved functional mobility and increased tolerance with ADLs.    Baseline 5/2: 55/80    Time 8    Period Weeks    Status New    Target Date 08/21/21      PT LONG TERM GOAL #4   Title Patient will have knee flexion and extension of >120 and neutral respectively to return to PLOF actively.    Baseline 5/2: flexion 111 extension -3; 5/18: 3-136 degrees (did not prop heel as to provoke hyperextension for measurement)    Time 8    Period Weeks    Status Achieved    Target Date 08/21/21      PT LONG TERM GOAL #5   Title Patient will return to running and gym workout program.    Baseline 5/2: unable to perform yet.    Time 16    Period Weeks    Status On-going    Target Date 11/01/21                   Plan - 07/17/21 0834     Clinical Impression Statement Continued with current plan of care as laid out in evaluation and recent prior sessions. All interventions and precautions maintained as indicated by postoperative protocol per referring  provider and/or subsequent communication with said provider. Author continues to advance interventions from single plane to multiplane and/or from open chair to closed chain when appropriate in order to maximize future carryover to daily functional tasks. Recovery intervals  given as needed for pain control, quality of movement, and at pt request. Mild to moderate fluctuations in postop edema persist, although gradual improvement is apparent. Surgical incision remains unremarkable upon visual inspection. Pt educated on best technique for each intervention- author uses verbal, visual, tactile cues to optimize learning. Author takes steps to maximize patient independence when appropriate. Pt remains highly motivated. Pt closely monitored throughout session for safe activity response, as well as to maximize patient safety during interventions. The patient's therapy prognosis indicates continued potential for improvement, anticipate that future progress is attainable in a reasonable/predictable timeframe. Maximum improvement is within reach. Pt will continue to benefit from skilled PT services to address deficits and impairment identified in evaluation in order to maximize independence and safety in basic mobility required for performance of ADL, IADL, and leisure.     Personal Factors and Comorbidities Comorbidity 2;Finances;Past/Current Experience    Comorbidities GERD, hx of ACL surgery    Examination-Activity Limitations Bathing;Bend;Carry;Dressing;Lift;Locomotion Level;Squat;Stairs;Stand    Examination-Participation Restrictions Cleaning;Community Activity;Laundry;Shop;Occupation;Volunteer;Yard Work;Other    Stability/Clinical Decision Making Stable/Uncomplicated    Clinical Decision Making Low    Rehab Potential Excellent    PT Frequency 2x / week    PT Duration 8 weeks    PT Treatment/Interventions ADLs/Self Care Home Management;Cryotherapy;Electrical Stimulation;Iontophoresis 4mg /ml Dexamethasone;Moist  Heat;Traction;Ultrasound;Therapeutic exercise;Therapeutic activities;Functional mobility training;Stair training;Gait training;Balance training;Neuromuscular re-education;Cognitive remediation;Patient/family education;Compression bandaging;Manual techniques;Scar mobilization;Passive range of motion;Dry needling;Energy conservation;Visual/perceptual remediation/compensation    PT Next Visit Plan follow protocol and nothing else    Consulted and Agree with Plan of Care Patient             Patient will benefit from skilled therapeutic intervention in order to improve the following deficits and impairments:  Abnormal gait, Decreased balance, Decreased endurance, Decreased mobility, Decreased range of motion, Difficulty walking, Decreased strength, Decreased scar mobility, Hypomobility, Increased edema, Impaired flexibility, Impaired sensation, Postural dysfunction, Pain  Visit Diagnosis: Stiffness of left knee, not elsewhere classified  Muscle weakness (generalized)  Difficulty in walking, not elsewhere classified     Problem List Patient Active Problem List   Diagnosis Date Noted   Pharyngitis 01/18/2020   Gastroesophageal reflux disease 01/18/2020   8:44 AM, 07/17/21 07/19/21, PT, DPT Physical Therapist - [facility]  Outpatient Physical Therapy- Main Campus [phone]     [place], PT 07/17/2021, 8:43 AM  [place] [facility] MAIN [facility] SERVICES [address] [place], [place], [place] Phone: [phone]   Fax:  [phone]  Name: Carmen Jackson MRN: Carmen Jackson Date of Birth: 03-13-2000

## 2021-07-19 ENCOUNTER — Encounter: Payer: Medicaid Other | Admitting: Physical Therapy

## 2021-07-25 ENCOUNTER — Ambulatory Visit: Payer: Medicaid Other

## 2021-07-25 DIAGNOSIS — M25662 Stiffness of left knee, not elsewhere classified: Secondary | ICD-10-CM

## 2021-07-25 DIAGNOSIS — R262 Difficulty in walking, not elsewhere classified: Secondary | ICD-10-CM

## 2021-07-25 DIAGNOSIS — M6281 Muscle weakness (generalized): Secondary | ICD-10-CM

## 2021-07-25 NOTE — Therapy (Signed)
[facility] [address] [place], [place], [address] Phone: [phone]   Fax:  [phone]  Physical Therapy Treatment  Patient Details  Name: Carmen Jackson MRN: [id number] Date of Birth: [birth year] Referring Provider (PT): Ramond MarrowVarkey, Dax MD   Encounter Date: 07/25/2021   PT End of Session - 07/25/21 0721     Visit Number 6    Number of Visits 16    Date for PT Re-Evaluation 08/21/21    Authorization Type [place] Medicaid [employer] (Authorization 06/27/21-08/26/21) 27 visits anually    Authorization Time Period Cert period 10/31/26-4/13/245/2/23-6/27/23    Authorization - Visit Number 5    Authorization - Number of Visits 27    Progress Note Due on Visit 10    PT Start Time 0716    PT Stop Time 0759    PT Time Calculation (min) 43 min    Equipment Utilized During Treatment Gait belt    Activity Tolerance Patient tolerated treatment well;No increased pain    Behavior During Therapy WFL for tasks assessed/performed             Past Medical History:  Diagnosis Date   GERD (gastroesophageal reflux disease)     Past Surgical History:  Procedure Laterality Date   ALLOGRAFT APPLICATION Left 05/25/2020   Procedure: ALLOGRAFT APPLICATION TIBIA AND FEMUR;  Surgeon: Bjorn PippinVarkey, Dax T, MD;  Location: [facility];  Service: Orthopedics;  Laterality: Left;   ANTERIOR CRUCIATE LIGAMENT REPAIR Left 2018   ANTERIOR CRUCIATE LIGAMENT REPAIR Left 06/07/2021   Procedure: ANTERIOR CRUCIATE LIGAMENT (ACL) REPAIR;  Surgeon: Bjorn PippinVarkey, Dax T, MD;  Location: [facility];  Service: Orthopedics;  Laterality: Left;   APPENDECTOMY     FRACTURE SURGERY     KNEE ARTHROSCOPY WITH MEDIAL MENISECTOMY Left 05/25/2020   Procedure: KNEE ARTHROSCOPY WITH MEDIAL MENISECTOMY;  Surgeon: Bjorn PippinVarkey, Dax T, MD;  Location: [facility];  Service: Orthopedics;  Laterality: Left;   KNEE RECONSTRUCTION Left 06/07/2021   Procedure: KNEE RECONSTRUCTION;   Surgeon: Bjorn PippinVarkey, Dax T, MD;  Location: [facility];  Service: Orthopedics;  Laterality: Left;    There were no vitals filed for this visit.   Subjective Assessment - 07/25/21 0720     Subjective Patient reports she is weaning from brace.    Pertinent History Patient underwent L ACL surgery on 06/07/21. Has unlocked brace to 30 degrees. Will meet with him in 3 weeks  She had an injury in June of 2018 playing basketball and track. She had an acute trauma December 2018 and was found to have a meniscus tear and ACL tear. She had an allograft or allograft augmented autograft hamstring combination performed. She recovered and and two instability events one in 2020 and another in 2023. 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    Currently in Pain? No/denies                Intervention:  -Nustep Lvl 6; LE only Lvl 4 4 minutes with focus on prevention of knee hyperextension and quad activation  -Chair squat STS 1x15, focus on symmetry x2 sets ; green disc under RLE for weight shift.  -standing double heel raise x20 narrow stance  -Left SLS 30 sec (no knee instability) -RDL with 5lb dumbells 15x; -lateral TRX lunge 10x each side   Supine -LLE quad set into towel roll 15x3secH  -LLE Heel slides 1x15 -LLE SLR 12" x10  -hooklying bridge 1x15 (  knees set to 90 degrees  -Left SAQ over blue bolster 1x15 (cues to avoid hyperextension) 5 second holds at top     Pt educated throughout session about proper posture and technique with exercises. Improved exercise technique, movement at target joints, use of target muscles after min to mod verbal, visual, tactile cues.  Patient tolerates progression with weaning of brace donned only for close chained activities. Patient has decreased hyperextension with external cueing for quad activation. She is able to perform TRX lateral squat well with slight limitation. Patient will benefit from skilled physical therapy to increase  strength, ROM, and mobility to [facility] to [facility]                     PT Education - 07/25/21 0721     Education Details precaution for knee, strengthening progression    Person(s) Educated Patient    Methods Explanation;Demonstration;Tactile cues;Verbal cues    Comprehension Verbalized understanding;Returned demonstration;Verbal cues required;Tactile cues required              PT Short Term Goals - 07/12/21 1209       PT SHORT TERM GOAL #1   Title Patient will be independent in home exercise program to improve strength/mobility for better functional independence with ADLs.    Baseline 5/2: HEP    Time 4    Period Weeks    Status Achieved    Target Date 07/26/21               PT Long Term Goals - 07/12/21 1209       PT LONG TERM GOAL #1   Title Patient will increase FOTO score to equal to or greater than 73%    to demonstrate statistically significant improvement in mobility and quality of life.    Baseline 5/2: 63%    Time 8    Period Weeks    Status On-going    Target Date 08/21/21      PT LONG TERM GOAL #2   Title Patient will increase BLE gross strength to 4+/5 as to improve functional strength for independent gait, increased standing tolerance and increased ADL ability.    Baseline 5/2: limited by protocol    Time 8    Status On-going    Target Date 08/21/21      PT LONG TERM GOAL #3   Title Patient will increase lower extremity functional scale to >75/80 to demonstrate improved functional mobility and increased tolerance with ADLs.    Baseline 5/2: 55/80    Time 8    Period Weeks    Status New    Target Date 08/21/21      PT LONG TERM GOAL #4   Title Patient will have knee flexion and extension of >120 and neutral respectively to return to PLOF actively.    Baseline 5/2: flexion 111 extension -3; 5/18: 3-136 degrees (did not prop heel as to provoke hyperextension for measurement)    Time 8    Period Weeks    Status Achieved     Target Date 08/21/21      PT LONG TERM GOAL #5   Title Patient will return to running and gym workout program.    Baseline 5/2: unable to perform yet.    Time 16    Period Weeks    Status On-going    Target Date 11/01/21                   Plan -  07/25/21 0744     Clinical Impression Statement Patient tolerates progression with weaning of brace donned only for close chained activities. Patient has decreased hyperextension with external cueing for quad activation. She is able to perform TRX lateral squat well with slight limitation. Patient will benefit from skilled physical therapy to increase strength, ROM, and mobility to return to PLOF    Personal Factors and Comorbidities Comorbidity 2;Finances;Past/Current Experience    Comorbidities GERD, hx of ACL surgery    Examination-Activity Limitations Bathing;Bend;Carry;Dressing;Lift;Locomotion Level;Squat;Stairs;Stand    Examination-Participation Restrictions Cleaning;Community Activity;Laundry;Shop;Occupation;Volunteer;Yard Work;Other    Stability/Clinical Decision Making Stable/Uncomplicated    Rehab Potential Excellent    PT Frequency 2x / week    PT Duration 8 weeks    PT Treatment/Interventions ADLs/Self Care Home Management;Cryotherapy;Electrical Stimulation;Iontophoresis 4mg /ml Dexamethasone;Moist Heat;Traction;Ultrasound;Therapeutic exercise;Therapeutic activities;Functional mobility training;Stair training;Gait training;Balance training;Neuromuscular re-education;Cognitive remediation;Patient/family education;Compression bandaging;Manual techniques;Scar mobilization;Passive range of motion;Dry needling;Energy conservation;Visual/perceptual remediation/compensation    PT Next Visit Plan knee ROM, strengthening    PT Home Exercise Plan see above    Consulted and Agree with Plan of Care Patient             Patient will benefit from skilled therapeutic intervention in order to improve the following deficits and  impairments:  Abnormal gait, Decreased balance, Decreased endurance, Decreased mobility, Decreased range of motion, Difficulty walking, Decreased strength, Decreased scar mobility, Hypomobility, Increased edema, Impaired flexibility, Impaired sensation, Postural dysfunction, Pain  Visit Diagnosis: Stiffness of left knee, not elsewhere classified  Muscle weakness (generalized)  Difficulty in walking, not elsewhere classified     Problem List Patient Active Problem List   Diagnosis Date Noted   Pharyngitis 01/18/2020   Gastroesophageal reflux disease 01/18/2020    01/20/2020, PT, DPT  07/25/2021, 7:59 AM  [place] [facility] MAIN [facility] SERVICES [address] [place], [place], [place] Phone: [phone]   Fax:  [phone]  Name: Marvelene Stoneberg MRN: Carmen Jackson Date of Birth: [birth year]

## 2021-08-02 ENCOUNTER — Ambulatory Visit: Payer: Medicaid Other | Attending: Physician Assistant

## 2021-08-02 DIAGNOSIS — R262 Difficulty in walking, not elsewhere classified: Secondary | ICD-10-CM | POA: Insufficient documentation

## 2021-08-02 DIAGNOSIS — M6281 Muscle weakness (generalized): Secondary | ICD-10-CM | POA: Diagnosis present

## 2021-08-02 DIAGNOSIS — M25662 Stiffness of left knee, not elsewhere classified: Secondary | ICD-10-CM | POA: Diagnosis present

## 2021-08-02 NOTE — Therapy (Signed)
[place] [facility] MAIN [facility] SERVICES [address] [place], [place], [address] Phone: [phone]   Fax:  [phone]  Physical Therapy Treatment  Patient Details  Name: Carmen Jackson MRN: [id number] Date of Birth: [birth year] Referring Provider (PT): Ramond Marrow MD   Encounter Date: 08/02/2021   PT End of Session - 08/02/21 0850     Visit Number 7    Number of Visits 16    Date for PT Re-Evaluation 08/21/21    Authorization Type [place] Medicaid [employer] (Authorization 06/27/21-08/26/21) 27 visits anually    Authorization Time Period Cert period 09/01/56-8/50/27    Authorization - Visit Number 6    Authorization - Number of Visits 27    Progress Note Due on Visit 10    PT Start Time 0845    PT Stop Time 0929    PT Time Calculation (min) 44 min    Equipment Utilized During Treatment Gait belt    Activity Tolerance Patient tolerated treatment well;No increased pain    Behavior During Therapy WFL for tasks assessed/performed             Past Medical History:  Diagnosis Date   GERD (gastroesophageal reflux disease)     Past Surgical History:  Procedure Laterality Date   ALLOGRAFT APPLICATION Left 05/25/2020   Procedure: ALLOGRAFT APPLICATION TIBIA AND FEMUR;  Surgeon: Bjorn Pippin, MD;  Location: [facility];  Service: Orthopedics;  Laterality: Left;   ANTERIOR CRUCIATE LIGAMENT REPAIR Left 2018   ANTERIOR CRUCIATE LIGAMENT REPAIR Left 06/07/2021   Procedure: ANTERIOR CRUCIATE LIGAMENT (ACL) REPAIR;  Surgeon: Bjorn Pippin, MD;  Location: [facility];  Service: Orthopedics;  Laterality: Left;   APPENDECTOMY     FRACTURE SURGERY     KNEE ARTHROSCOPY WITH MEDIAL MENISECTOMY Left 05/25/2020   Procedure: KNEE ARTHROSCOPY WITH MEDIAL MENISECTOMY;  Surgeon: Bjorn Pippin, MD;  Location: [facility];  Service: Orthopedics;  Laterality: Left;   KNEE RECONSTRUCTION Left 06/07/2021   Procedure: KNEE RECONSTRUCTION;   Surgeon: Bjorn Pippin, MD;  Location: [facility];  Service: Orthopedics;  Laterality: Left;    There were no vitals filed for this visit.   Subjective Assessment - 08/02/21 0848     Subjective Patient presents without her brace. Has been compliant with HEP.    Pertinent History Patient underwent L ACL surgery on 06/07/21. Has unlocked brace to 30 degrees. Will meet with him in 3 weeks  She had an injury in June of 2018 playing basketball and track. She had an acute trauma December 2018 and was found to have a meniscus tear and ACL tear. She had an allograft or allograft augmented autograft hamstring combination performed. She recovered and and two instability events one in 2020 and another in 2023. 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    Currently in Pain? No/denies               Intervention:  -Nustep seat 5; LE only Lvl 4 4 minutes with focus on prevention of knee hyperextension and quad activation  Knee extension machine in wellzone: -10x each LE, single limb a time x 2 trials : 1 block  Hamstring cutrl machine in wellzone: -10x each LE, single limb at a time x2 trials ; 1 block  Superset:2 sets  Wall squat 30 seconds Overhead weighted ball march 10x each LE     Supine Bridge with bosu ball round side up 12x;  2 sets Single limb bridge 10x each LE  SLR arc over hedgehog 12x each LE in supine position  ; second set in long sit position 12x each LE    Pt educated throughout session about proper posture and technique with exercises. Improved exercise technique, movement at target joints, use of target muscles after min to mod verbal, visual, tactile cues.      Patient tolerates progression of strengthening well. She is without her brace today but is able to maintain proper alignment with no episodes of hyperextension. Patient will benefit from skilled physical therapy to increase strength, ROM, and mobility [facility]                       PT Education - 08/02/21 0849     Education Details exercise technique, body mechanics    Person(s) Educated Patient    Methods Explanation;Demonstration;Tactile cues;Verbal cues    Comprehension Verbalized understanding;Returned demonstration;Verbal cues required;Tactile cues required              PT Short Term Goals - 07/12/21 1209       PT SHORT TERM GOAL #1   Title Patient will be independent in home exercise program to improve strength/mobility for better functional independence with ADLs.    Baseline 5/2: HEP    Time 4    Period Weeks    Status Achieved    Target Date 07/26/21               PT Long Term Goals - 07/12/21 1209       PT LONG TERM GOAL #1   Title Patient will increase FOTO score to equal to or greater than 73%    to demonstrate statistically significant improvement in mobility and quality of life.    Baseline 5/2: 63%    Time 8    Period Weeks    Status On-going    Target Date 08/21/21      PT LONG TERM GOAL #2   Title Patient will increase BLE gross strength to 4+/5 as to improve functional strength for independent gait, increased standing tolerance and increased ADL ability.    Baseline 5/2: limited by protocol    Time 8    Status On-going    Target Date 08/21/21      PT LONG TERM GOAL #3   Title Patient will increase lower extremity functional scale to >75/80 to demonstrate improved functional mobility and increased tolerance with ADLs.    Baseline 5/2: 55/80    Time 8    Period Weeks    Status New    Target Date 08/21/21      PT LONG TERM GOAL #4   Title Patient will have knee flexion and extension of >120 and neutral respectively to return to PLOF actively.    Baseline 5/2: flexion 111 extension -3; 5/18: 3-136 degrees (did not prop heel as to provoke hyperextension for measurement)    Time 8    Period Weeks    Status Achieved    Target Date 08/21/21      PT LONG TERM GOAL #5    Title Patient will return to running and gym workout program.    Baseline 5/2: unable to perform yet.    Time 16    Period Weeks    Status On-going    Target Date 11/01/21  Plan - 08/02/21 0913     Clinical Impression Statement Patient tolerates progression of strengthening well. She is without her brace today but is able to maintain proper alignment with no episodes of hyperextension. Patient will benefit from skilled physical therapy to increase strength, ROM, and mobility to return to PLOF    Personal Factors and Comorbidities Comorbidity 2;Finances;Past/Current Experience    Comorbidities GERD, hx of ACL surgery    Examination-Activity Limitations Bathing;Bend;Carry;Dressing;Lift;Locomotion Level;Squat;Stairs;Stand    Examination-Participation Restrictions Cleaning;Community Activity;Laundry;Shop;Occupation;Volunteer;Yard Work;Other    Stability/Clinical Decision Making Stable/Uncomplicated    Rehab Potential Excellent    PT Frequency 2x / week    PT Duration 8 weeks    PT Treatment/Interventions ADLs/Self Care Home Management;Cryotherapy;Electrical Stimulation;Iontophoresis 4mg /ml Dexamethasone;Moist Heat;Traction;Ultrasound;Therapeutic exercise;Therapeutic activities;Functional mobility training;Stair training;Gait training;Balance training;Neuromuscular re-education;Cognitive remediation;Patient/family education;Compression bandaging;Manual techniques;Scar mobilization;Passive range of motion;Dry needling;Energy conservation;Visual/perceptual remediation/compensation    PT Next Visit Plan knee ROM, strengthening    PT Home Exercise Plan see above    Consulted and Agree with Plan of Care Patient             Patient will benefit from skilled therapeutic intervention in order to improve the following deficits and impairments:  Abnormal gait, Decreased balance, Decreased endurance, Decreased mobility, Decreased range of motion, Difficulty walking, Decreased  strength, Decreased scar mobility, Hypomobility, Increased edema, Impaired flexibility, Impaired sensation, Postural dysfunction, Pain  Visit Diagnosis: Stiffness of left knee, not elsewhere classified  Muscle weakness (generalized)  Difficulty in walking, not elsewhere classified     Problem List Patient Active Problem List   Diagnosis Date Noted   Pharyngitis 01/18/2020   Gastroesophageal reflux disease 01/18/2020    01/20/2020, PT, DPT  08/02/2021, 9:30 AM  [place] MAIN [facility] SERVICES [address] [place], [place], [place] Phone: [phone]   Fax:  [phone]  Name: Lovena Kluck MRN: Jeanine Luz Date of Birth: [birth year]

## 2021-08-06 ENCOUNTER — Encounter: Payer: Medicaid Other | Admitting: Physical Therapy

## 2021-08-08 ENCOUNTER — Encounter: Payer: Self-pay | Admitting: Physical Therapy

## 2021-08-08 ENCOUNTER — Ambulatory Visit: Payer: Medicaid Other | Admitting: Physical Therapy

## 2021-08-08 NOTE — Therapy (Signed)
[place] [facility] MAIN [facility] SERVICES [address] [place], [place], [address] Phone: [phone]   Fax:  [phone]  Patient Details  Name: Carmen Jackson MRN: [id number] Date of Birth: Sep 27, 2000 Referring Provider:  No ref. provider found  Encounter Date: 08/08/2021   Patient no-showed for 08/08/21  Called, no answer, unable to leave message as voicemail is full   Weyman Bogdon, PT 08/08/2021, 10:50 AM  [place] [facility] MAIN [facility] SERVICES [address] [place], [place], [address] Phone: [phone]   Fax:  [phone]

## 2021-08-13 ENCOUNTER — Ambulatory Visit: Payer: Medicaid Other

## 2021-08-13 DIAGNOSIS — M25662 Stiffness of left knee, not elsewhere classified: Secondary | ICD-10-CM | POA: Diagnosis not present

## 2021-08-13 DIAGNOSIS — R262 Difficulty in walking, not elsewhere classified: Secondary | ICD-10-CM

## 2021-08-13 DIAGNOSIS — M6281 Muscle weakness (generalized): Secondary | ICD-10-CM

## 2021-08-13 NOTE — Therapy (Signed)
[facility] MAIN [facility] SERVICES [address] [place], [place], [address] Phone: [phone]   Fax:  [phone]  Physical Therapy Treatment  Patient Details  Name: Carmen Jackson MRN: [id number] Date of Birth: 21-Dec-2000 Referring Provider (PT): Ramond Marrow MD   Encounter Date: 08/13/2021   PT End of Session - 08/13/21 1259     Visit Number 8    Number of Visits 16    Date for PT Re-Evaluation 08/21/21    Authorization Type [place] [employer] (Authorization 06/27/21-08/26/21) 27 visits anually    Authorization Time Period Cert period 06/28/95-6/73/41    Authorization - Visit Number 7    Authorization - Number of Visits 27    Progress Note Due on Visit 10    PT Start Time 1300    PT Stop Time 1344    PT Time Calculation (min) 44 min    Equipment Utilized During Treatment Gait belt    Activity Tolerance Patient tolerated treatment well;No increased pain    Behavior During Therapy WFL for tasks assessed/performed             Past Medical History:  Diagnosis Date   GERD (gastroesophageal reflux disease)     Past Surgical History:  Procedure Laterality Date   ALLOGRAFT APPLICATION Left 05/25/2020   Procedure: ALLOGRAFT APPLICATION TIBIA AND FEMUR;  Surgeon: Bjorn Pippin, MD;  Location: [facility];  Service: Orthopedics;  Laterality: Left;   ANTERIOR CRUCIATE LIGAMENT REPAIR Left 2018   ANTERIOR CRUCIATE LIGAMENT REPAIR Left 06/07/2021   Procedure: ANTERIOR CRUCIATE LIGAMENT (ACL) REPAIR;  Surgeon: Bjorn Pippin, MD;  Location: [facility];  Service: Orthopedics;  Laterality: Left;   APPENDECTOMY     FRACTURE SURGERY     KNEE ARTHROSCOPY WITH MEDIAL MENISECTOMY Left 05/25/2020   Procedure: KNEE ARTHROSCOPY WITH MEDIAL MENISECTOMY;  Surgeon: Bjorn Pippin, MD;  Location: [facility];  Service: Orthopedics;  Laterality: Left;   KNEE RECONSTRUCTION Left 06/07/2021   Procedure: KNEE RECONSTRUCTION;   Surgeon: Bjorn Pippin, MD;  Location: [facility];  Service: Orthopedics;  Laterality: Left;    There were no vitals filed for this visit.   Subjective Assessment - 08/13/21 1334     Subjective Patient returning from trip. Has been compliant with HEP.    Pertinent History Patient underwent L ACL surgery on 06/07/21. Has unlocked brace to 30 degrees. Will meet with him in 3 weeks  She had an injury in June of 2018 playing basketball and track. She had an acute trauma December 2018 and was found to have a meniscus tear and ACL tear. She had an allograft or allograft augmented autograft hamstring combination performed. She recovered and and two instability events one in 2020 and another in 2023. 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    Currently in Pain? No/denies                   Intervention:  -spin bike 4 minutes intermittent resistance  Knee extension machine in wellzone: -10x each LE, single limb a time x 2 trials : 2 block   Hamstring curl machine in wellzone: -10x each LE, single limb at a time x2 trials ; 3 block    Wall squat over weighted ball raises 10x   RTB around ankles: lateral squat walk 4x 10 ft      Supine Bridge with bosu ball round side up 12x; 2 sets  with second set squeezing small ball  Single limb bridge 10x each LE     Pt educated throughout session about proper posture and technique with exercises. Improved exercise technique, movement at target joints, use of target muscles after min to mod verbal, visual, tactile cues.    Patient is able to tolerate progressive strengthening and stabilization of surgical limb without pain increase. Her quad strength continues to be an area of improvement as well as weight shifts and transfers. Patient is highly motivated for progression and next session will work on initiation of jumping for carryover to jogging. Patient will benefit from skilled physical therapy to increase  strength, ROM, and mobility to return to [facility]                   PT Education - 08/13/21 1259     Education Details exercise technique, body mechanics    Person(s) Educated Patient    Methods Explanation;Demonstration;Tactile cues;Verbal cues    Comprehension Verbalized understanding;Returned demonstration;Verbal cues required;Tactile cues required              PT Short Term Goals - 07/12/21 1209       PT SHORT TERM GOAL #1   Title Patient will be independent in home exercise program to improve strength/mobility for better functional independence with ADLs.    Baseline 5/2: HEP    Time 4    Period Weeks    Status Achieved    Target Date 07/26/21               PT Long Term Goals - 07/12/21 1209       PT LONG TERM GOAL #1   Title Patient will increase FOTO score to equal to or greater than 73%    to demonstrate statistically significant improvement in mobility and quality of life.    Baseline 5/2: 63%    Time 8    Period Weeks    Status On-going    Target Date 08/21/21      PT LONG TERM GOAL #2   Title Patient will increase BLE gross strength to 4+/5 as to improve functional strength for independent gait, increased standing tolerance and increased ADL ability.    Baseline 5/2: limited by protocol    Time 8    Status On-going    Target Date 08/21/21      PT LONG TERM GOAL #3   Title Patient will increase lower extremity functional scale to >75/80 to demonstrate improved functional mobility and increased tolerance with ADLs.    Baseline 5/2: 55/80    Time 8    Period Weeks    Status New    Target Date 08/21/21      PT LONG TERM GOAL #4   Title Patient will have knee flexion and extension of >120 and neutral respectively to return to PLOF actively.    Baseline 5/2: flexion 111 extension -3; 5/18: 3-136 degrees (did not prop heel as to provoke hyperextension for measurement)    Time 8    Period Weeks    Status Achieved    Target Date 08/21/21       PT LONG TERM GOAL #5   Title Patient will return to running and gym workout program.    Baseline 5/2: unable to perform yet.    Time 16    Period Weeks    Status On-going    Target Date 11/01/21  Plan - 08/13/21 1652     Clinical Impression Statement Patient is able to tolerate progressive strengthening and stabilization of surgical limb without pain increase. Her quad strength continues to be an area of improvement as well as weight shifts and transfers. Patient is highly motivated for progression and next session will work on initiation of jumping for carryover to jogging. Patient will benefit from skilled physical therapy to increase strength, ROM, and mobility to return to PLOF    Personal Factors and Comorbidities Comorbidity 2;Finances;Past/Current Experience    Comorbidities GERD, hx of ACL surgery    Examination-Activity Limitations Bathing;Bend;Carry;Dressing;Lift;Locomotion Level;Squat;Stairs;Stand    Examination-Participation Restrictions Cleaning;Community Activity;Laundry;Shop;Occupation;Volunteer;Yard Work;Other    Stability/Clinical Decision Making Stable/Uncomplicated    Rehab Potential Excellent    PT Frequency 2x / week    PT Duration 8 weeks    PT Treatment/Interventions ADLs/Self Care Home Management;Cryotherapy;Electrical Stimulation;Iontophoresis 4mg /ml Dexamethasone;Moist Heat;Traction;Ultrasound;Therapeutic exercise;Therapeutic activities;Functional mobility training;Stair training;Gait training;Balance training;Neuromuscular re-education;Cognitive remediation;Patient/family education;Compression bandaging;Manual techniques;Scar mobilization;Passive range of motion;Dry needling;Energy conservation;Visual/perceptual remediation/compensation    PT Next Visit Plan knee ROM, strengthening    PT Home Exercise Plan see above    Consulted and Agree with Plan of Care Patient             Patient will benefit from skilled therapeutic  intervention in order to improve the following deficits and impairments:  Abnormal gait, Decreased balance, Decreased endurance, Decreased mobility, Decreased range of motion, Difficulty walking, Decreased strength, Decreased scar mobility, Hypomobility, Increased edema, Impaired flexibility, Impaired sensation, Postural dysfunction, Pain  Visit Diagnosis: Stiffness of left knee, not elsewhere classified  Muscle weakness (generalized)  Difficulty in walking, not elsewhere classified     Problem List Patient Active Problem List   Diagnosis Date Noted   Pharyngitis 01/18/2020   Gastroesophageal reflux disease 01/18/2020    01/20/2020, PT, DPT  08/13/2021, 4:53 PM  [place] [facility] MAIN [facility] SERVICES [address] [place], [place], [place] Phone: [phone]   Fax:  [phone]  Name: Carmen Jackson MRN: Jeanine Luz Date of Birth: [birth year]

## 2021-08-15 ENCOUNTER — Encounter: Payer: Medicaid Other | Admitting: Physical Therapy

## 2021-08-16 NOTE — Therapy (Incomplete)
OUTPATIENT PHYSICAL THERAPY TREATMENT NOTE/RECERT   Patient Name: Carmen Jackson MRN: [id number] DOB:2000-12-03, 21 y.o., female Today's Date: 08/16/2021  PCP: Gretta Cool REFERRING PROVIDER: Zella Ball    Past Medical History:  Diagnosis Date   GERD (gastroesophageal reflux disease)    Past Surgical History:  Procedure Laterality Date   ALLOGRAFT APPLICATION Left 05/25/2020   Procedure: ALLOGRAFT APPLICATION TIBIA AND FEMUR;  Surgeon: Bjorn Pippin, MD;  Location: [facility];  Service: Orthopedics;  Laterality: Left;   ANTERIOR CRUCIATE LIGAMENT REPAIR Left 2018   ANTERIOR CRUCIATE LIGAMENT REPAIR Left 06/07/2021   Procedure: ANTERIOR CRUCIATE LIGAMENT (ACL) REPAIR;  Surgeon: Bjorn Pippin, MD;  Location: [facility];  Service: Orthopedics;  Laterality: Left;   APPENDECTOMY     FRACTURE SURGERY     KNEE ARTHROSCOPY WITH MEDIAL MENISECTOMY Left 05/25/2020   Procedure: KNEE ARTHROSCOPY WITH MEDIAL MENISECTOMY;  Surgeon: Bjorn Pippin, MD;  Location: [facility];  Service: Orthopedics;  Laterality: Left;   KNEE RECONSTRUCTION Left 06/07/2021   Procedure: KNEE RECONSTRUCTION;  Surgeon: Bjorn Pippin, MD;  Location: [facility];  Service: Orthopedics;  Laterality: Left;   Patient Active Problem List   Diagnosis Date Noted   Pharyngitis 01/18/2020   Gastroesophageal reflux disease 01/18/2020    REFERRING DIAG: left knee arthroscopy with BTB ACVL reconstruction  THERAPY DIAG:  No diagnosis found.  Rationale for Evaluation and Treatment Rehabilitation  PERTINENT HISTORY: Patient underwent L ACL surgery on 06/07/21. Has unlocked brace to 30 degrees. Will meet with him in 3 weeks  She had an injury in June of 2018 playing basketball and track. She had an acute trauma December 2018 and was found to have a meniscus tear and ACL tear. She had an allograft or allograft augmented autograft hamstring combination performed. She  recovered and and two instability events one in 2020 and another in 2023. 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   PRECAUTIONS: ACL protocol  SUBJECTIVE: ***  PAIN:  Are you having pain? {[id number]}     TODAY'S TREATMENT:  Goal Assessment:  FOTO BLE strength LEFS  Return to gym program/running:  Treatment: Introduction to jumping: Utilization of personal trampoline: bilateral small jumps with UE support x3 minutes Single limb jumps with UE support x 3 minutes   PATIENT EDUCATION: Education details: Pt educated throughout session about proper posture and technique with exercises. Improved exercise technique, movement at target joints, use of target muscles after min to mod verbal, visual, tactile cues.  Person educated: Patient Education method: Explanation, Demonstration, Tactile cues, and Verbal cues Education comprehension: verbalized understanding, returned demonstration, verbal cues required, and tactile cues required   HOME EXERCISE PROGRAM: Quad set, SLR, squat, lateral squat, wall sit    PT Short Term Goals      PT SHORT TERM GOAL #1   Title Patient will be independent in home exercise program to improve strength/mobility for better functional independence with ADLs.    Baseline 5/2: HEP    Time 4    Period Weeks    Status Achieved    Target Date 07/26/21              PT Long Term Goals       PT LONG TERM GOAL #1   Title Patient will increase FOTO score to equal to or greater than 73%    to demonstrate statistically significant improvement in mobility and quality of life.  Baseline 5/2: 63%    Time 8    Period Weeks    Status On-going    Target Date 08/21/21      PT LONG TERM GOAL #2   Title Patient will increase BLE gross strength to 4+/5 as to improve functional strength for independent gait, increased standing tolerance and increased ADL ability.    Baseline 5/2: limited by protocol    Time 8    Status On-going     Target Date 08/21/21      PT LONG TERM GOAL #3   Title Patient will increase lower extremity functional scale to >75/80 to demonstrate improved functional mobility and increased tolerance with ADLs.    Baseline 5/2: 55/80    Time 8    Period Weeks    Status New    Target Date 08/21/21      PT LONG TERM GOAL #4   Title Patient will have knee flexion and extension of >120 and neutral respectively to return to PLOF actively.    Baseline 5/2: flexion 111 extension -3; 5/18: 3-136 degrees (did not prop heel as to provoke hyperextension for measurement)    Time 8    Period Weeks    Status Achieved    Target Date 08/21/21      PT LONG TERM GOAL #5   Title Patient will return to running and gym workout program.    Baseline 5/2: unable to perform yet.    Time 16    Period Weeks    Status On-going    Target Date 11/01/21              ****   Precious Bard, PT 08/16/2021, 8:32 PM

## 2021-08-20 ENCOUNTER — Ambulatory Visit: Payer: Medicaid Other

## 2021-08-20 DIAGNOSIS — M25662 Stiffness of left knee, not elsewhere classified: Secondary | ICD-10-CM

## 2021-08-20 DIAGNOSIS — R262 Difficulty in walking, not elsewhere classified: Secondary | ICD-10-CM

## 2021-08-20 DIAGNOSIS — M6281 Muscle weakness (generalized): Secondary | ICD-10-CM

## 2021-08-26 IMAGING — CR DG KNEE 1-2V PORT*L*
2 series · 2 of 2 positions shown · non-contrast
Comparison: 09/16/2019, 09/07/2019

CLINICAL DATA: Previous ACL repair, postoperative evaluation

EXAM:
PORTABLE LEFT KNEE - 1-2 VIEW

[knee ap]
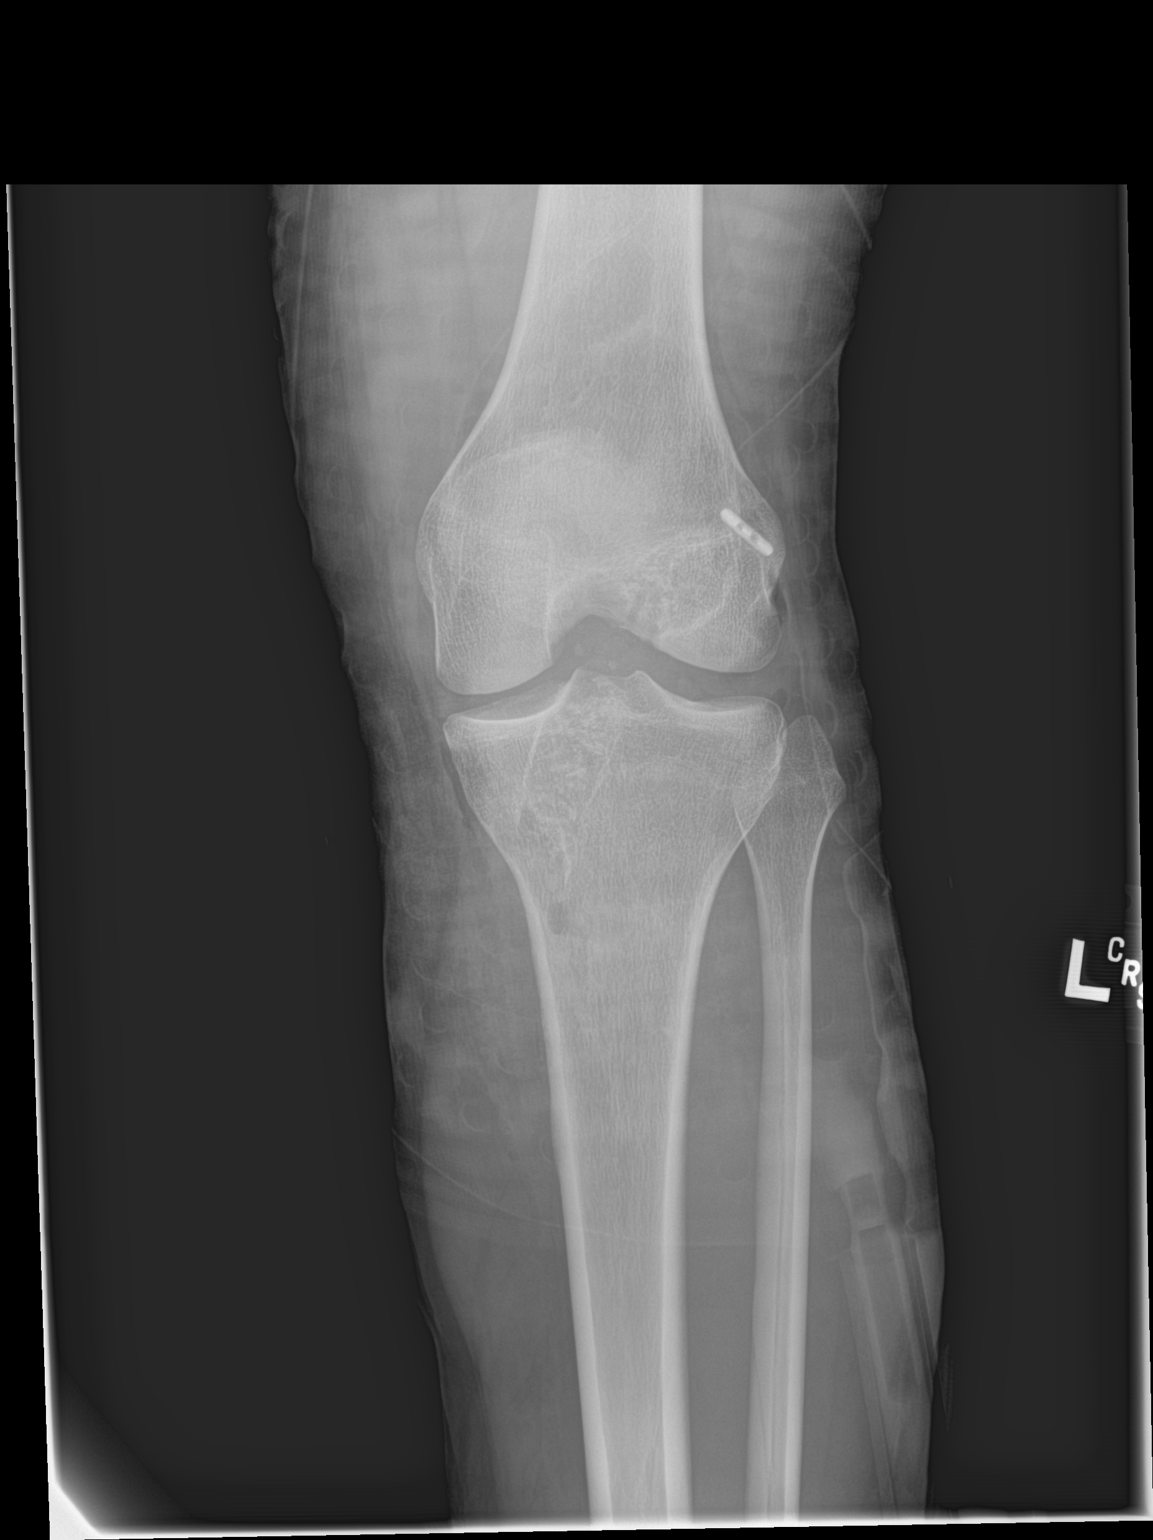

[knee lat]
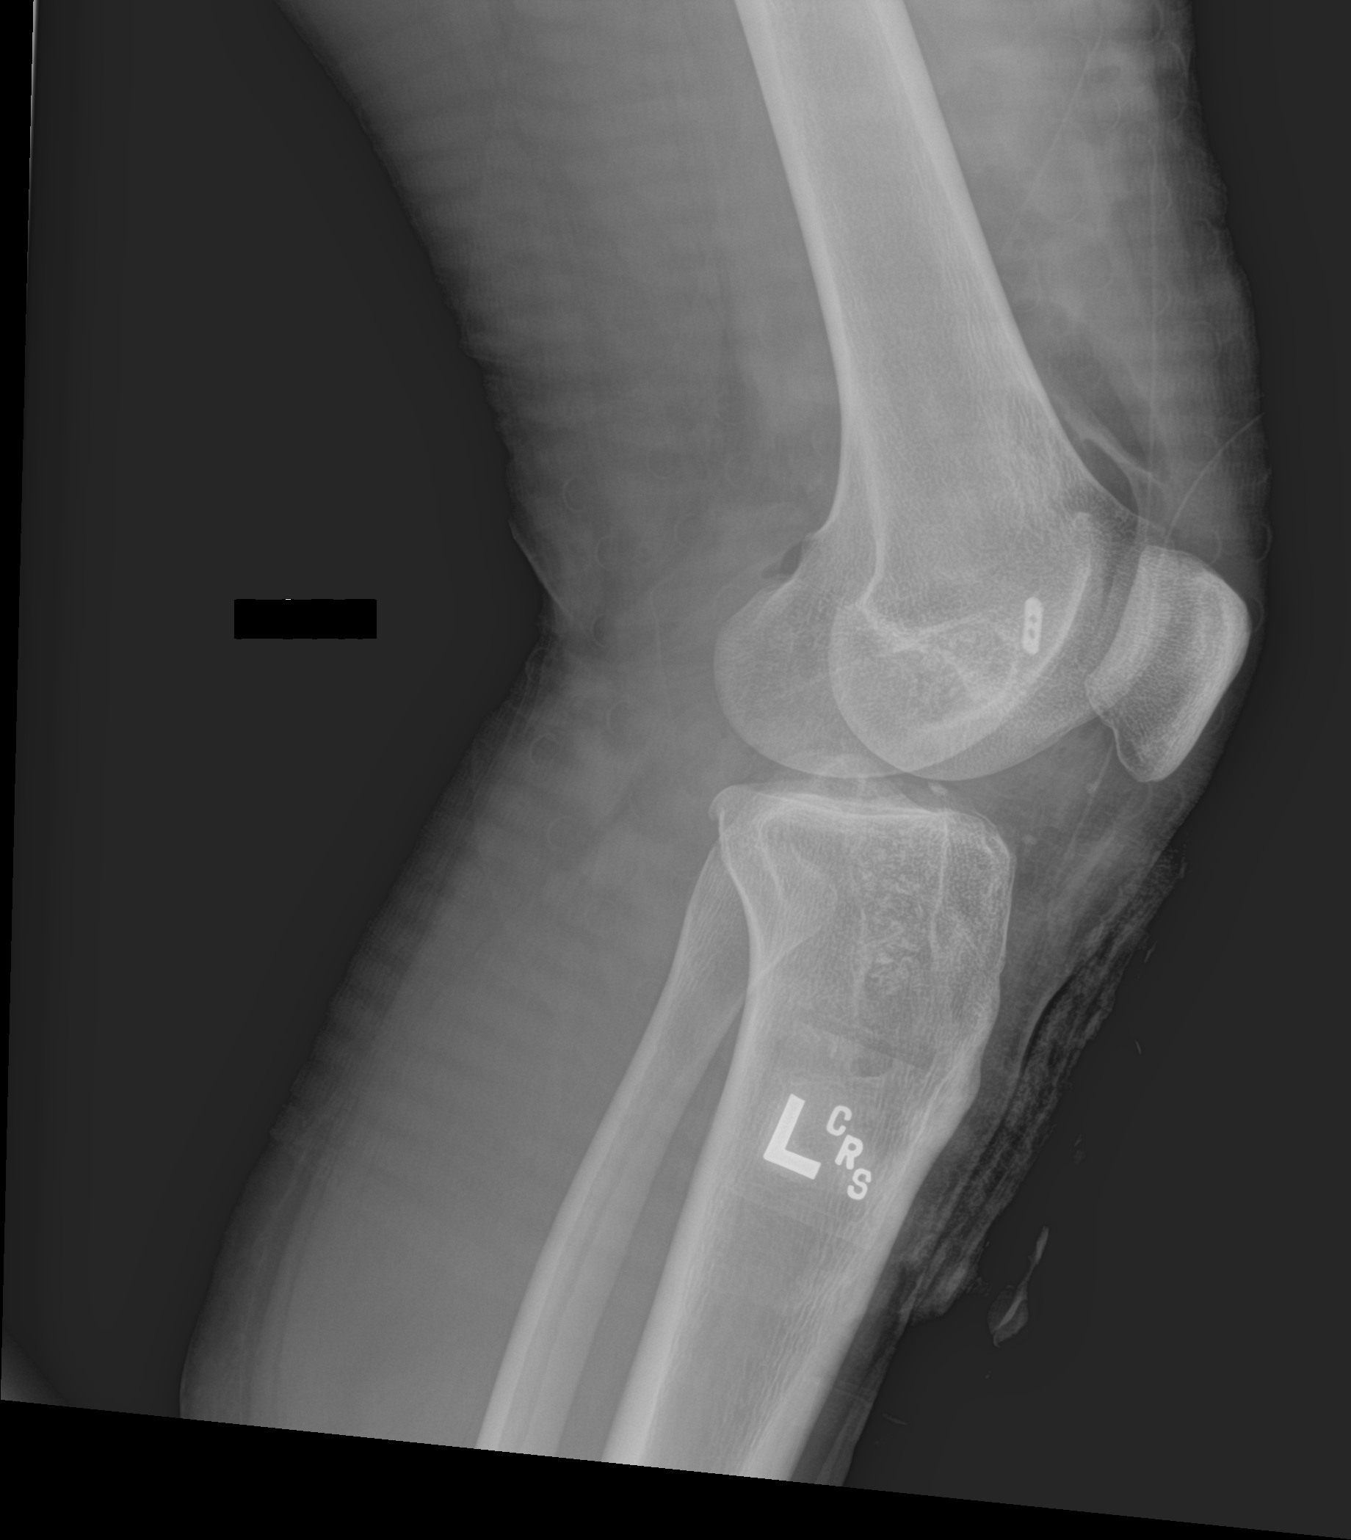

[2 of 2 positions shown; findings below may reference images not displayed]

FINDINGS: Frontal and lateral views of the left knee are obtained.
Postsurgical changes are seen from ACL repair. Gas is identified
throughout the joint space consistent with recent surgical
intervention. No acute fractures.
IMPRESSION: 1. Postoperative changes as above.  No acute bony abnormality.

## 2021-08-27 ENCOUNTER — Ambulatory Visit: Payer: Medicaid Other | Attending: Physician Assistant

## 2021-08-27 DIAGNOSIS — M6281 Muscle weakness (generalized): Secondary | ICD-10-CM | POA: Diagnosis not present

## 2021-08-27 DIAGNOSIS — R262 Difficulty in walking, not elsewhere classified: Secondary | ICD-10-CM | POA: Diagnosis present

## 2021-08-27 NOTE — Therapy (Signed)
OUTPATIENT PHYSICAL THERAPY TREATMENT NOTE/Physical Therapy Progress Note   Dates of reporting period  06/26/2021   to   08/27/2021    Patient Name: Carmen Jackson MRN: [id number] DOB:October 23, 2000, 21 y.o., female Today's Date: 08/27/2021  PCP: Gretta Cool REFERRING PROVIDER: Vernetta Honey PA-C   PT End of Session - 08/27/21 1016     Visit Number 10    Number of Visits 17    Date for PT Re-Evaluation 10/15/21    Authorization Type [place] Medicaid [employer] (Authorization 06/27/21-08/26/21) 27 visits anually    Authorization Time Period Cert period 06/27/64-4/40/34    Authorization - Visit Number 8    Authorization - Number of Visits 27    Progress Note Due on Visit 10    PT Start Time 0811    PT Stop Time 0845    PT Time Calculation (min) 34 min    Equipment Utilized During Treatment Gait belt    Activity Tolerance Patient tolerated treatment well;No increased pain    Behavior During Therapy WFL for tasks assessed/performed              Past Medical History:  Diagnosis Date   GERD (gastroesophageal reflux disease)    Past Surgical History:  Procedure Laterality Date   ALLOGRAFT APPLICATION Left 05/25/2020   Procedure: ALLOGRAFT APPLICATION TIBIA AND FEMUR;  Surgeon: Bjorn Pippin, MD;  Location: [facility];  Service: Orthopedics;  Laterality: Left;   ANTERIOR CRUCIATE LIGAMENT REPAIR Left 2018   ANTERIOR CRUCIATE LIGAMENT REPAIR Left 06/07/2021   Procedure: ANTERIOR CRUCIATE LIGAMENT (ACL) REPAIR;  Surgeon: Bjorn Pippin, MD;  Location: [facility];  Service: Orthopedics;  Laterality: Left;   APPENDECTOMY     FRACTURE SURGERY     KNEE ARTHROSCOPY WITH MEDIAL MENISECTOMY Left 05/25/2020   Procedure: KNEE ARTHROSCOPY WITH MEDIAL MENISECTOMY;  Surgeon: Bjorn Pippin, MD;  Location: [facility];  Service: Orthopedics;  Laterality: Left;   KNEE RECONSTRUCTION Left 06/07/2021   Procedure: KNEE RECONSTRUCTION;  Surgeon: Bjorn Pippin, MD;  Location:  [facility];  Service: Orthopedics;  Laterality: Left;   Patient Active Problem List   Diagnosis Date Noted   Pharyngitis 01/18/2020   Gastroesophageal reflux disease 01/18/2020    REFERRING DIAG: left knee arthroscopy with BTB ACVL reconstruction  THERAPY DIAG:  Muscle weakness (generalized)  Difficulty in walking, not elsewhere classified  Rationale for Evaluation and Treatment Rehabilitation  PERTINENT HISTORY: Patient underwent L ACL surgery on 06/07/21. Has unlocked brace to 30 degrees. Will meet with him in 3 weeks  She had an injury in June of 2018 playing basketball and track. She had an acute trauma December 2018 and was found to have a meniscus tear and ACL tear. She had an allograft or allograft augmented autograft hamstring combination performed. She recovered and and two instability events one in 2020 and another in 2023. 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   PRECAUTIONS: ACL protocol  SUBJECTIVE: Patient reports no pain currently. Pt reports she went back to work for first time this weekend for a 4 hour shift (wears her brace at work). She notices after about 2-3 hours of standing she starts to limp a little bit due to feeing tired. She is easing back into work 2x a week.   PAIN:  Are you having pain? No    TODAY'S TREATMENT:   Warm up on Nustep BUE/BLE level 2-4 interval training x4 min (unbilled). Pt rates as  easy   Return-to-jogging training on treadmill. Initiates with gradually increasing speed from fast walk to slow jog (up to 4.1 mph). Pt on treadmill total 8 min. Pt reports no pain with intervention/jogging.  Jogging down long hallway 3x80 ft. Rates easy, no pain with intervention/jogging.    TRX squat 10x full ROM, 10x partial ROM.    NMR:  Standing on airex pad: Standing one foot on airex, one foot on 6 inch step 30 sec each LE position  Progressed to performing with vertical and horizontal head turns 30 sec for each LE  position   Firm surface: Tandem stance 30 sec each LE   Airex:   Tandem stance 30 sec each LE      PATIENT EDUCATION: Education details: Pt educated throughout session about proper posture and technique with exercises. Improved exercise technique, movement at target joints, use of target muscles after min to mod verbal, visual, tactile cues.  Person educated: Patient Education method: Explanation, Demonstration, Tactile cues, and Verbal cues Education comprehension: verbalized understanding, returned demonstration, and verbal cues required   HOME EXERCISE PROGRAM: no recent updates Quad set, SLR, squat, lateral squat, wall sit    PT Short Term Goals      PT SHORT TERM GOAL #1   Title Patient will be independent in home exercise program to improve strength/mobility for better functional independence with ADLs.    Baseline 5/2: HEP    Time 4    Period Weeks    Status Achieved    Target Date 07/26/21              PT Long Term Goals       PT LONG TERM GOAL #1   Title Patient will increase FOTO score to equal to or greater than 73%    to demonstrate statistically significant improvement in mobility and quality of life.    Baseline 5/2: 63% 6/26: 64%   Time 8    Period Weeks    Status On-going    Target Date 10/15/21     PT LONG TERM GOAL #2   Title Patient will increase BLE gross strength to 4+/5 as to improve functional strength for independent gait, increased standing tolerance and increased ADL ability.    Baseline 5/2: limited by protocol 6/26: grossly 4+/5 L hamstring 4/5    Time 8    Status On-going    Target Date 10/15/21     PT LONG TERM GOAL #3   Title Patient will increase lower extremity functional scale to >75/80 to demonstrate improved functional mobility and increased tolerance with ADLs.    Baseline 5/2: 55/80 6/26: 63%   Time 8    Period Weeks    Status New    Target Date 10/15/21     PT LONG TERM GOAL #4   Title Patient will have knee  flexion and extension of >120 and neutral respectively to return to PLOF actively.    Baseline 5/2: flexion 111 extension -3; 5/18: 3-136 degrees (did not prop heel as to provoke hyperextension for measurement)    Time 8    Period Weeks    Status Achieved    Target Date 08/21/21      PT LONG TERM GOAL #5   Title Patient will return to running and gym workout program.    Baseline 5/2: unable to perform yet. 6/26:returned to gym but not yet running   Time 16    Period Weeks    Status On-going  Target Date 10/15/21             Plan     Clinical Impression Statement Goals reassessed for recert previous session. Please refer to note from 08/20/2021 for details. Pt progressed to performing slow jog on treadmill and firm surface in clinic hallway. She tolerated intervention well without pain, but did report feeling "out of shape" and early onset of fatigue. No significant abnormalities in jogging mechanics observed. The patient will benefit from further skilled physical therapy to increase strength, ROM, and mobility to return to PLOF    Personal Factors and Comorbidities Comorbidity 2;Finances;Past/Current Experience    Comorbidities GERD, hx of ACL surgery    Examination-Activity Limitations Bathing;Bend;Carry;Dressing;Lift;Locomotion Level;Squat;Stairs;Stand    Examination-Participation Restrictions Cleaning;Community Activity;Laundry;Shop;Occupation;Volunteer;Yard Work;Other    Stability/Clinical Decision Making Stable/Uncomplicated    Rehab Potential Excellent    PT Frequency 1x / week    PT Duration 8 weeks    PT Treatment/Interventions ADLs/Self Care Home Management;Cryotherapy;Electrical Stimulation;Iontophoresis 4mg /ml Dexamethasone;Moist Heat;Traction;Ultrasound;Therapeutic exercise;Therapeutic activities;Functional mobility training;Stair training;Gait training;Balance training;Neuromuscular re-education;Cognitive remediation;Patient/family education;Compression bandaging;Manual  techniques;Scar mobilization;Passive range of motion;Dry needling;Energy conservation;Visual/perceptual remediation/compensation    PT Next Visit Plan return to running and jumping    PT Home Exercise Plan see above    Consulted and Agree with Plan of Care Patient             PT, DPT   08/27/2021, 10:24 AM

## 2021-09-04 ENCOUNTER — Ambulatory Visit: Payer: Medicaid Other

## 2021-09-11 ENCOUNTER — Ambulatory Visit: Payer: Medicaid Other

## 2021-09-11 ENCOUNTER — Telehealth: Payer: Self-pay

## 2021-09-11 NOTE — Telephone Encounter (Signed)
Patient called due to no show x 2 appointments. Left voicemail with date of next appointment.

## 2021-09-24 ENCOUNTER — Ambulatory Visit: Payer: Medicaid Other

## 2021-10-04 ENCOUNTER — Encounter: Payer: Self-pay | Admitting: Physical Therapy

## 2021-10-04 ENCOUNTER — Ambulatory Visit: Payer: Medicaid Other | Attending: Physician Assistant

## 2021-10-04 DIAGNOSIS — R262 Difficulty in walking, not elsewhere classified: Secondary | ICD-10-CM | POA: Insufficient documentation

## 2021-10-04 DIAGNOSIS — M6281 Muscle weakness (generalized): Secondary | ICD-10-CM | POA: Insufficient documentation

## 2021-10-04 DIAGNOSIS — M25662 Stiffness of left knee, not elsewhere classified: Secondary | ICD-10-CM | POA: Insufficient documentation

## 2021-10-04 NOTE — Therapy (Signed)
OUTPATIENT PHYSICAL THERAPY TREATMENT NOTE   Patient Name: Carmen Jackson MRN: [id number] DOB:06-Dec-2000, 21 y.o., female Today's Date: 10/04/2021  PCP: Gretta Cool REFERRING PROVIDER: Vernetta Honey PA-C   PT End of Session - 10/04/21 0804     Visit Number 11    Number of Visits 17    Date for PT Re-Evaluation 10/15/21    Authorization Type [place] Medicaid [employer] (Authorization 06/27/21-08/26/21) 27 visits anually    Authorization Time Period Cert period 11/03/90-03/16/39    Authorization - Visit Number 8    Authorization - Number of Visits 27    Progress Note Due on Visit 10    PT Start Time 0805    PT Stop Time 0844    PT Time Calculation (min) 39 min    Equipment Utilized During Treatment Gait belt    Activity Tolerance Patient tolerated treatment well;No increased pain    Behavior During Therapy WFL for tasks assessed/performed              Past Medical History:  Diagnosis Date   GERD (gastroesophageal reflux disease)    Past Surgical History:  Procedure Laterality Date   ALLOGRAFT APPLICATION Left 05/25/2020   Procedure: ALLOGRAFT APPLICATION TIBIA AND FEMUR;  Surgeon: Bjorn Pippin, MD;  Location: [facility];  Service: Orthopedics;  Laterality: Left;   ANTERIOR CRUCIATE LIGAMENT REPAIR Left 2018   ANTERIOR CRUCIATE LIGAMENT REPAIR Left 06/07/2021   Procedure: ANTERIOR CRUCIATE LIGAMENT (ACL) REPAIR;  Surgeon: Bjorn Pippin, MD;  Location: [facility];  Service: Orthopedics;  Laterality: Left;   APPENDECTOMY     FRACTURE SURGERY     KNEE ARTHROSCOPY WITH MEDIAL MENISECTOMY Left 05/25/2020   Procedure: KNEE ARTHROSCOPY WITH MEDIAL MENISECTOMY;  Surgeon: Bjorn Pippin, MD;  Location: [facility];  Service: Orthopedics;  Laterality: Left;   KNEE RECONSTRUCTION Left 06/07/2021   Procedure: KNEE RECONSTRUCTION;  Surgeon: Bjorn Pippin, MD;  Location:  SURGERY CENTER;  Service: Orthopedics;  Laterality: Left;   Patient Active  Problem List   Diagnosis Date Noted   Pharyngitis 01/18/2020   Gastroesophageal reflux disease 01/18/2020    REFERRING DIAG: left knee arthroscopy with BTB ACVL reconstruction  THERAPY DIAG:  Muscle weakness (generalized)  Difficulty in walking, not elsewhere classified  Stiffness of left knee, not elsewhere classified  Rationale for Evaluation and Treatment Rehabilitation  PERTINENT HISTORY: Patient underwent L ACL surgery on 06/07/21. Has unlocked brace to 30 degrees. Will meet with him in 3 weeks  She had an injury in June of 2018 playing basketball and track. She had an acute trauma December 2018 and was found to have a meniscus tear and ACL tear. She had an allograft or allograft augmented autograft hamstring combination performed. She recovered and and two instability events one in 2020 and another in 2023. 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   PRECAUTIONS: ACL protocol  SUBJECTIVE: Patient reports she has been working out 3x/week minimum. Has been running and performing leg days without difficulty.   PAIN:  Are you having pain? No    TODAY'S TREATMENT:  10/04/21  There.ex:  Return-to-jogging training on treadmill. Initiates with gradually increasing speed from fast walk to slow jog (3.0 to 5 mph) 2 minute intervals fast pace walk to jog working up speed. Pt on treadmill total 9.5 min. Pt reports no pain with intervention/jogging. Symmetrical running noted throughout LE's. No pain with jogging intervals. 1.5 minute cool down at 3 MPH  BLE leg press: x8, 100#  Single Leg leg press   RLE: 70#, 3 rep max  LLE: 70 #, 3 rep max. 1x6, BORG 10 point scale 7/10.   Lateral hops   Single LE TRX pistol squats with chair as tactile cue for depth:   1x10/RLE  2x10 on LLE. Reports very difficult with eccentric portion of control  LLE lateral lunge onto BOSU: 3x8, excellent form/technique.   Single leg stance on BOSU ball side up: LLE, 3x45 sec     PATIENT  EDUCATION: Education details: form/technique with exercise. Person educated: Patient Education method: Explanation, Demonstration, Tactile cues, and Verbal cues Education comprehension: verbalized understanding, returned demonstration, and verbal cues required   HOME EXERCISE PROGRAM: no recent updates Quad set, SLR, squat, lateral squat, wall sit    PT Short Term Goals      PT SHORT TERM GOAL #1   Title Patient will be independent in home exercise program to improve strength/mobility for better functional independence with ADLs.    Baseline 5/2: HEP    Time 4    Period Weeks    Status Achieved    Target Date 07/26/21              PT Long Term Goals       PT LONG TERM GOAL #1   Title Patient will increase FOTO score to equal to or greater than 73%    to demonstrate statistically significant improvement in mobility and quality of life.    Baseline 5/2: 63% 6/26: 64%   Time 8    Period Weeks    Status On-going    Target Date 10/15/21     PT LONG TERM GOAL #2   Title Patient will increase BLE gross strength to 4+/5 as to improve functional strength for independent gait, increased standing tolerance and increased ADL ability.    Baseline 5/2: limited by protocol 6/26: grossly 4+/5 L hamstring 4/5    Time 8    Status On-going    Target Date 10/15/21     PT LONG TERM GOAL #3   Title Patient will increase lower extremity functional scale to >75/80 to demonstrate improved functional mobility and increased tolerance with ADLs.    Baseline 5/2: 55/80 6/26: 63%   Time 8    Period Weeks    Status New    Target Date 10/15/21     PT LONG TERM GOAL #4   Title Patient will have knee flexion and extension of >120 and neutral respectively to return to PLOF actively.    Baseline 5/2: flexion 111 extension -3; 5/18: 3-136 degrees (did not prop heel as to provoke hyperextension for measurement)    Time 8    Period Weeks    Status Achieved    Target Date 08/21/21      PT LONG  TERM GOAL #5   Title Patient will return to running and gym workout program.    Baseline 5/2: unable to perform yet. 6/26:returned to gym but not yet running   Time 16    Period Weeks    Status On-going    Target Date 10/15/21             Plan     Clinical Impression Statement Continuing PT POC with further dynamic SLS resistance exercise and return to running training. Pt remains with symmetrical running mechanics progressing to 5 MPH without pain but does report onset of fatigue. Noted, symmetrical 3 rep max leg press 70 #  per limb but does demonstrate in functional positions, LLE weakness compared to RLE with eccentric portions. Pt will continue to benefit from skilled PT services to progress single limb strength in functional positions to return to symmetrical limb strength for full participation in recreational activities.     Personal Factors and Comorbidities Comorbidity 2;Finances;Past/Current Experience    Comorbidities GERD, hx of ACL surgery    Examination-Activity Limitations Bathing;Bend;Carry;Dressing;Lift;Locomotion Level;Squat;Stairs;Stand    Examination-Participation Restrictions Cleaning;Community Activity;Laundry;Shop;Occupation;Volunteer;Yard Work;Other    Stability/Clinical Decision Making Stable/Uncomplicated    Rehab Potential Excellent    PT Frequency 1x / week    PT Duration 8 weeks    PT Treatment/Interventions ADLs/Self Care Home Management;Cryotherapy;Electrical Stimulation;Iontophoresis 4mg /ml Dexamethasone;Moist Heat;Traction;Ultrasound;Therapeutic exercise;Therapeutic activities;Functional mobility training;Stair training;Gait training;Balance training;Neuromuscular re-education;Cognitive remediation;Patient/family education;Compression bandaging;Manual techniques;Scar mobilization;Passive range of motion;Dry needling;Energy conservation;Visual/perceptual remediation/compensation    PT Next Visit Plan return to running and jumping    PT Home Exercise Plan  see above    Consulted and Agree with Plan of Care Patient             . Fairly IV, PT, DPT Physical Therapist- [place]  [facility]  10/04/2021, 8:43 AM

## 2021-10-09 ENCOUNTER — Ambulatory Visit: Payer: Medicaid Other

## 2021-10-26 ENCOUNTER — Emergency Department
Admission: EM | Admit: 2021-10-26 | Discharge: 2021-10-26 | Disposition: A | Discharge status: Home / Self Care | Payer: Medicaid Other | Attending: Emergency Medicine | Admitting: Emergency Medicine

## 2021-10-26 ENCOUNTER — Other Ambulatory Visit: Payer: Self-pay

## 2021-10-26 DIAGNOSIS — N3090 Cystitis, unspecified without hematuria: Secondary | ICD-10-CM | POA: Insufficient documentation

## 2021-10-26 DIAGNOSIS — R300 Dysuria: Secondary | ICD-10-CM | POA: Diagnosis present

## 2021-10-26 LAB — URINALYSIS, ROUTINE W REFLEX MICROSCOPIC
Bacteria, UA: NONE SEEN
Bilirubin Urine: NEGATIVE
Glucose, UA: NEGATIVE mg/dL
Ketones, ur: 5 mg/dL — AB
Nitrite: NEGATIVE
Protein, ur: 100 mg/dL — AB
RBC / HPF: >50 RBC/hpf — ABNORMAL HIGH (ref 0–5)
Specific Gravity, Urine: 1.031 — ABNORMAL HIGH (ref 1.005–1.030)
Squamous Epithelial / HPF: >50 — ABNORMAL HIGH (ref 0–5)
WBC, UA: >50 WBC/hpf — ABNORMAL HIGH (ref 0–5)
pH: 5 (ref 5.0–8.0)

## 2021-10-26 LAB — PREGNANCY, URINE: Preg Test, Ur: NEGATIVE

## 2021-10-26 MED ORDER — CEPHALEXIN 500 MG PO CAPS: 500.0000 mg | ORAL_CAPSULE | Freq: Two times a day (BID) | ORAL | 0 refills | Status: AC

## 2021-10-26 NOTE — ED Triage Notes (Addendum)
Pt states "I have a UTI". Pt states has frequency, small amounts of urine produced at a time. Pt appears in no acute distress.

## 2021-10-26 NOTE — Discharge Instructions (Signed)
-  Take all of your antibiotics as prescribed.  Continue to hydrate frequently throughout the day.  -Return to the emergency department anytime if you begin to experience any new or worsening symptoms.

## 2021-10-26 NOTE — ED Notes (Signed)
Pt and visitor given warm blankets. °

## 2021-10-26 NOTE — ED Notes (Signed)
Pt reports dec urination though frequency; denies blood in urine; lower abd pain intermittently pressure and then sharp; denies fever/sweats/chills; reports history of UTIs; denies lower back pain. Pt's in NAD. Visitor remains at bedside.

## 2021-10-26 NOTE — ED Provider Notes (Signed)
[facility] Provider Note    Event Date/Time   First MD Initiated Contact with Patient 10/26/21 1941     (approximate)   History   Chief Complaint Dysuria   HPI Carmen Jackson is a 21 y.o. female, history of GERD, presents emergency department for evaluation of dysuria.  Patient states that she has been having urinary frequency and small amounts of urine produced at a time.  She states this is consistent with her previous urinary tract infections that she has had in the past.  Endorses some mild sharp pain along the bladder region.  Denies fever/chills, myalgias, flank pain, abdominal pain, nausea/vomiting, diarrhea, hematuria, or headache.  Denies vaginal bleeding or discharge.  History Limitations: No limitations.        Physical Exam  Triage Vital Signs: ED Triage Vitals [10/26/21 1905]  Enc Vitals Group     BP 128/69     Pulse Rate 72     Resp 16     Temp 98.4 F (36.9 C)     Temp Source Oral     SpO2 96 %     Weight 140 lb (63.5 kg)     Height 5\' 2"  (1.575 m)     Head Circumference      Peak Flow      Pain Score 8     Pain Loc      Pain Edu?      Excl. in GC?     Most recent vital signs: Vitals:   10/26/21 1905 10/26/21 2002  BP: 128/69 129/88  Pulse: 72 66  Resp: 16 16  Temp: 98.4 F (36.9 C)   SpO2: 96% 100%    General: Awake, NAD.  Skin: Warm, dry. No rashes or lesions.  Eyes: PERRL. Conjunctivae normal.  CV: Good peripheral perfusion.  Resp: Normal effort.  Abd: Soft, non-tender. No distention.  No CVAT Neuro: At baseline. No gross neurological deficits.  Musculoskeletal: Normal ROM of all extremities.   Focused Exam: N/A.  Physical Exam    ED Results / Procedures / Treatments  Labs (all labs ordered are listed, but only abnormal results are displayed) Labs Reviewed  URINALYSIS, ROUTINE W REFLEX MICROSCOPIC - Abnormal; Notable for the following components:      Result Value   Color, Urine YELLOW (*)     APPearance CLOUDY (*)    Specific Gravity, Urine 1.031 (*)    Hgb urine dipstick LARGE (*)    Ketones, ur 5 (*)    Protein, ur 100 (*)    Leukocytes,Ua LARGE (*)    RBC / HPF >50 (*)    WBC, UA >50 (*)    Squamous Epithelial / LPF >50 (*)    Non Squamous Epithelial PRESENT (*)    All other components within normal limits  PREGNANCY, URINE     EKG N/A.   RADIOLOGY  ED Provider Interpretation: N/A.  No results found.  PROCEDURES:  Critical Care performed: N/A.  Procedures    MEDICATIONS ORDERED IN ED: Medications - No data to display   IMPRESSION / MDM / ASSESSMENT AND PLAN / ED COURSE  I reviewed the triage vital signs and the nursing notes.                              Differential diagnosis includes, but is not limited to, cystitis, pyelonephritis, urethritis, chlamydia/gonorrhea.  Assessment/Plan Presentation consistent with cystitis.  Physical exam not suggestive  of pyelonephritis or ureterolithiasis.  She appears well clinically.  Vitals within normal limits.  Currently afebrile.  Urinalysis does show evidence of urinary tract infection, though given the high amounts of squamous epithelial cells, did advise patient that this is potentially a contaminated sample.  Nonetheless, she is currently endorsing typical cystitis signs/symptoms and will treat as such.  Provide her with a prescription for cephalexin.  Encouraged her to follow with her primary care provider as needed.  Will discharge.  Provided the patient with anticipatory guidance, return precautions, and educational material. Encouraged the patient to return to the emergency department at any time if they begin to experience any new or worsening symptoms. Patient expressed understanding and agreed with the plan.   Patient's presentation is most consistent with acute complicated illness / injury requiring diagnostic workup.       FINAL CLINICAL IMPRESSION(S) / ED DIAGNOSES   Final diagnoses:   Cystitis     Rx / DC Orders   ED Discharge Orders          Ordered    cephALEXin (KEFLEX) 500 MG capsule  2 times daily        10/26/21 2025             Note:  This document was prepared using Dragon voice recognition software and may include unintentional dictation errors.   Varney Daily, [place] 10/26/21 2032    Arnaldo Natal, MD 10/26/21 2252

## 2021-10-29 ENCOUNTER — Emergency Department: Payer: Medicaid Other

## 2021-10-29 ENCOUNTER — Other Ambulatory Visit: Payer: Self-pay

## 2021-10-29 ENCOUNTER — Emergency Department
Admission: EM | Admit: 2021-10-29 | Discharge: 2021-10-29 | Disposition: A | Discharge status: Home / Self Care | Payer: Medicaid Other | Attending: Emergency Medicine | Admitting: Emergency Medicine

## 2021-10-29 DIAGNOSIS — Z20822 Contact with and (suspected) exposure to covid-19: Secondary | ICD-10-CM | POA: Insufficient documentation

## 2021-10-29 DIAGNOSIS — R5081 Fever presenting with conditions classified elsewhere: Secondary | ICD-10-CM

## 2021-10-29 DIAGNOSIS — R509 Fever, unspecified: Secondary | ICD-10-CM | POA: Diagnosis present

## 2021-10-29 DIAGNOSIS — J069 Acute upper respiratory infection, unspecified: Secondary | ICD-10-CM

## 2021-10-29 DIAGNOSIS — R051 Acute cough: Secondary | ICD-10-CM

## 2021-10-29 LAB — CBC WITH DIFFERENTIAL/PLATELET
Abs Immature Granulocytes: 0.04 10*3/uL (ref 0.00–0.07)
Basophils Absolute: 0 10*3/uL (ref 0.0–0.1)
Basophils Relative: 1 %
Eosinophils Absolute: 0 10*3/uL (ref 0.0–0.5)
Eosinophils Relative: 0 %
HCT: 39.8 % (ref 36.0–46.0)
Hemoglobin: 13.1 g/dL (ref 12.0–15.0)
Immature Granulocytes: 1 %
Lymphocytes Relative: 30 %
Lymphs Abs: 1.3 10*3/uL (ref 0.7–4.0)
MCH: 29.5 pg (ref 26.0–34.0)
MCHC: 32.9 g/dL (ref 30.0–36.0)
MCV: 89.6 fL (ref 80.0–100.0)
Monocytes Absolute: 0.4 10*3/uL (ref 0.1–1.0)
Monocytes Relative: 10 %
Neutro Abs: 2.5 10*3/uL (ref 1.7–7.7)
Neutrophils Relative %: 58 %
Platelets: 100 10*3/uL — ABNORMAL LOW (ref 150–400)
RBC: 4.44 MIL/uL (ref 3.87–5.11)
RDW: 12 % (ref 11.5–15.5)
WBC: 4.3 10*3/uL (ref 4.0–10.5)
nRBC: 0 % (ref 0.0–0.2)

## 2021-10-29 LAB — URINALYSIS, ROUTINE W REFLEX MICROSCOPIC
Bilirubin Urine: NEGATIVE
Glucose, UA: NEGATIVE mg/dL
Hgb urine dipstick: NEGATIVE
Ketones, ur: 80 mg/dL — AB
Leukocytes,Ua: NEGATIVE
Nitrite: NEGATIVE
Protein, ur: 30 mg/dL — AB
Specific Gravity, Urine: 1.024 (ref 1.005–1.030)
pH: 5 (ref 5.0–8.0)

## 2021-10-29 LAB — URINE CULTURE: Culture: >=100000 — AB

## 2021-10-29 LAB — COMPREHENSIVE METABOLIC PANEL
ALT: 12 U/L (ref 0–44)
AST: 22 U/L (ref 15–41)
Albumin: 4.1 g/dL (ref 3.5–5.0)
Alkaline Phosphatase: 63 U/L (ref 38–126)
Anion gap: 8 (ref 5–15)
BUN: 12 mg/dL (ref 6–20)
CO2: 23 mmol/L (ref 22–32)
Calcium: 8.9 mg/dL (ref 8.9–10.3)
Chloride: 105 mmol/L (ref 98–111)
Creatinine, Ser: 1.03 mg/dL — ABNORMAL HIGH (ref 0.44–1.00)
GFR, Estimated: >60 mL/min (ref >60)
Glucose, Bld: 97 mg/dL (ref 70–99)
Potassium: 3.8 mmol/L (ref 3.5–5.1)
Sodium: 136 mmol/L (ref 135–145)
Total Bilirubin: 0.7 mg/dL (ref 0.3–1.2)
Total Protein: 8.1 g/dL (ref 6.5–8.1)

## 2021-10-29 LAB — POC URINE PREG, ED: Preg Test, Ur: NEGATIVE

## 2021-10-29 LAB — SARS CORONAVIRUS 2 BY RT PCR: SARS Coronavirus 2 by RT PCR: NEGATIVE

## 2021-10-29 MED ORDER — IBUPROFEN 600 MG PO TABS
600.0000 mg | ORAL_TABLET | Freq: Once | ORAL | Status: AC
  Administered 2021-10-29: 600 mg via ORAL
  Filled 2021-10-29: qty 1

## 2021-10-29 MED ORDER — LACTATED RINGERS IV BOLUS
1000.0000 mL | Freq: Once | INTRAVENOUS | Status: AC
  Administered 2021-10-29: 1000 mL via INTRAVENOUS

## 2021-10-29 NOTE — Discharge Instructions (Signed)
Please take Tylenol and ibuprofen/Advil for your pain.  It is safe to take them together, or to alternate them every few hours.  Take up to 1000mg  of Tylenol at a time, up to 4 times per day.  Do not take more than 4000 mg of Tylenol in 24 hours.  For ibuprofen, take 400-600 mg, 3 - 4 times per day.  Continue your antibiotics for your UTI and finish that prescription.   Consider something like Sudafed or Mucinex to help with your congestion.

## 2021-10-29 NOTE — ED Provider Notes (Signed)
[facility] Provider Note    Event Date/Time   First MD Initiated Contact with Patient 10/29/21 [phone]     (approximate)   History   Fever, Cough, and Numbness (/)   HPI  Carmen Jackson is a 21 y.o. female who presents to the ED for evaluation of Fever, Cough, and Numbness (/)   I review ED visit from 3 days ago where patient was evaluated for dysuria, diagnosed with cystitis and discharged with Keflex.  Patient reports improvement of her urinary symptoms, but she developed myalgias, respiratory congestion, cough and fevers over the past 1-2 days.  Based off of her return precautions regarding fevers she presents to the ED.  No sick contacts.  No abdominal pain or emesis.  Does report poor p.o. intake in the setting of her malaise and feeling unwell.   Physical Exam   Triage Vital Signs: ED Triage Vitals [10/29/21 0024]  Enc Vitals Group     BP 117/84     Pulse Rate (!) 108     Resp 20     Temp (!) 101.6 F (38.7 C)     Temp Source Oral     SpO2 97 %     Weight      Height      Head Circumference      Peak Flow      Pain Score      Pain Loc      Pain Edu?      Excl. in GC?     Most recent vital signs: Vitals:   10/29/21 0349 10/29/21 0508  BP:  114/73  Pulse:  70  Resp:  17  Temp:  98.3 F (36.8 C)  SpO2: 99% 99%    General: Awake, no distress.  Looks systemically well. CV:  Good peripheral perfusion.  Resp:  Normal effort.  Abd:  No distention.  Soft and benign MSK:  No deformity noted.  Neuro:  No focal deficits appreciated. Other:     ED Results / Procedures / Treatments   Labs (all labs ordered are listed, but only abnormal results are displayed) Labs Reviewed  CBC WITH DIFFERENTIAL/PLATELET - Abnormal; Notable for the following components:      Result Value   Platelets 100 (*)    All other components within normal limits  COMPREHENSIVE METABOLIC PANEL - Abnormal; Notable for the following components:   Creatinine,  Ser 1.03 (*)    All other components within normal limits  URINALYSIS, ROUTINE W REFLEX MICROSCOPIC - Abnormal; Notable for the following components:   Color, Urine YELLOW (*)    APPearance CLOUDY (*)    Ketones, ur 80 (*)    Protein, ur 30 (*)    Bacteria, UA RARE (*)    All other components within normal limits  SARS CORONAVIRUS 2 BY RT PCR  POC URINE PREG, ED    EKG   RADIOLOGY CXR interpreted by me without evidence of acute cardiopulmonary pathology.  Official radiology report(s): DG Chest 2 View  Result Date: 10/29/2021 CLINICAL DATA:  Fever and cough. EXAM: CHEST - 2 VIEW COMPARISON:  None Available. FINDINGS: The heart size and mediastinal contours are within normal limits. Both lungs are clear. The visualized skeletal structures are unremarkable. IMPRESSION: No active cardiopulmonary disease. Electronically Signed   By: Kennith Center M.D.   On: 10/29/2021 05:17    PROCEDURES and INTERVENTIONS:  .1-3 Lead EKG Interpretation  Performed by: Delton Prairie, MD Authorized by: Delton Prairie, MD  Interpretation: normal     ECG rate:  70   ECG rate assessment: normal     Rhythm: sinus rhythm     Ectopy: none     Conduction: normal     Medications  ibuprofen (ADVIL) tablet 600 mg (600 mg Oral Given 10/29/21 0032)  lactated ringers bolus 1,000 mL (0 mLs Intravenous Stopped 10/29/21 0526)     IMPRESSION / MDM / ASSESSMENT AND PLAN / ED COURSE  I reviewed the triage vital signs and the nursing notes.  Differential diagnosis includes, but is not limited to, sepsis, pyelonephritis, cystitis, pneumonia, URI, viral syndrome  {Patient presents with symptoms of an acute illness or injury that is potentially life-threatening.  21yo female presents to the ED for evaluation of a couple days of fever, cough, congestion with evidence of a likely viral syndrome such as acute bronchitis, ultimately suitable for outpatient management.  She is febrile and tachycardic, but looks  systemically well.  Has a reassuring serum work-up with a normal CBC and CMP.  No leukocytosis to suggest bacterial etiology of her symptoms to cause sepsis.  Her urine demonstrates ketones suggestive of dehydration, but no indications of residual infectious etiology that would point to pyelonephritis or sepsis.  Patient negative for COVID and has a clear CXR without pneumonia.  After IV fluids and anti-inflammatories, she reports feeling much better.  Tolerated p.o. intake and I see no barriers to outpatient management.  I suspect that unfortunately time superimposed viral URI in the setting of her recent treatment for cystitis.  We will discharge with return precautions.  Clinical Course as of 10/29/21 0538  Mon Oct 29, 2021  0532 Reassessed. Feeling better. Discussed workup. Discussed management at home and return precautions.  [DS]    Clinical Course User Index [DS] Delton Prairie, MD     FINAL CLINICAL IMPRESSION(S) / ED DIAGNOSES   Final diagnoses:  Viral upper respiratory tract infection  Fever in other diseases  Acute cough     Rx / DC Orders   ED Discharge Orders     None        Note:  This document was prepared using Dragon voice recognition software and may include unintentional dictation errors.   Delton Prairie, MD 10/29/21 [phone]

## 2021-10-29 NOTE — ED Notes (Signed)
Covid swab sent to the lab at this time.  

## 2021-10-29 NOTE — ED Triage Notes (Signed)
Pt presents to ER from home with c/o fever, cough, generalized body aches.  Pt states her fever started today, but has had other symptoms for last 3 days.  Pt was seen here recently for UTI, but states symptoms for that have been getting better.  Pt does state she has had some new onset lower extremity numbness and tingling.  Pt denies loss of bowel or bladder since it started.  States numbness started in feet and has moved up.  Pt does still have feeling to legs when palpated.  Pt otherwise A&O x4 at this time in NAD in triage.

## 2021-10-29 NOTE — ED Notes (Signed)
Pt denies lower extremity tingling and numbness at this time.

## 2022-09-08 IMAGING — CR DG KNEE 1-2V PORT*L*
2 series · 2 of 2 positions shown · non-contrast
Comparison: CT EXAMINATION DATED September 07, 2020

CLINICAL DATA: Postop check ACL repair.

EXAM:
PORTABLE LEFT KNEE - 1-2 VIEW

[knee ap]
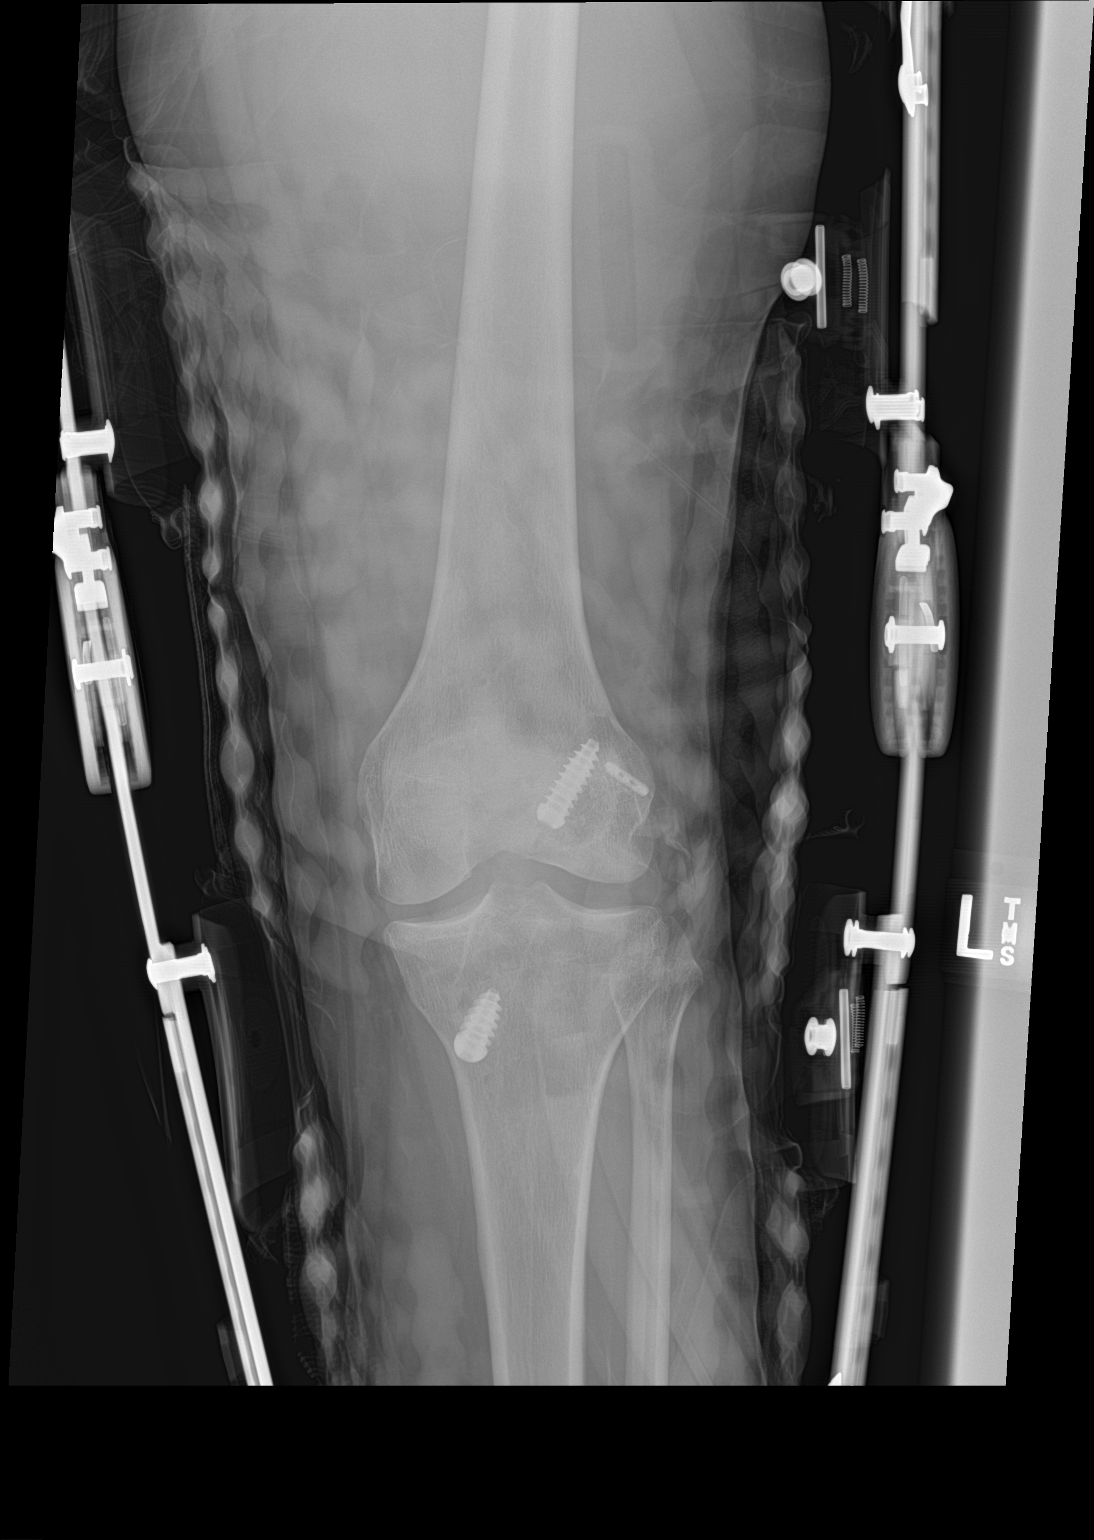

[knee obl]
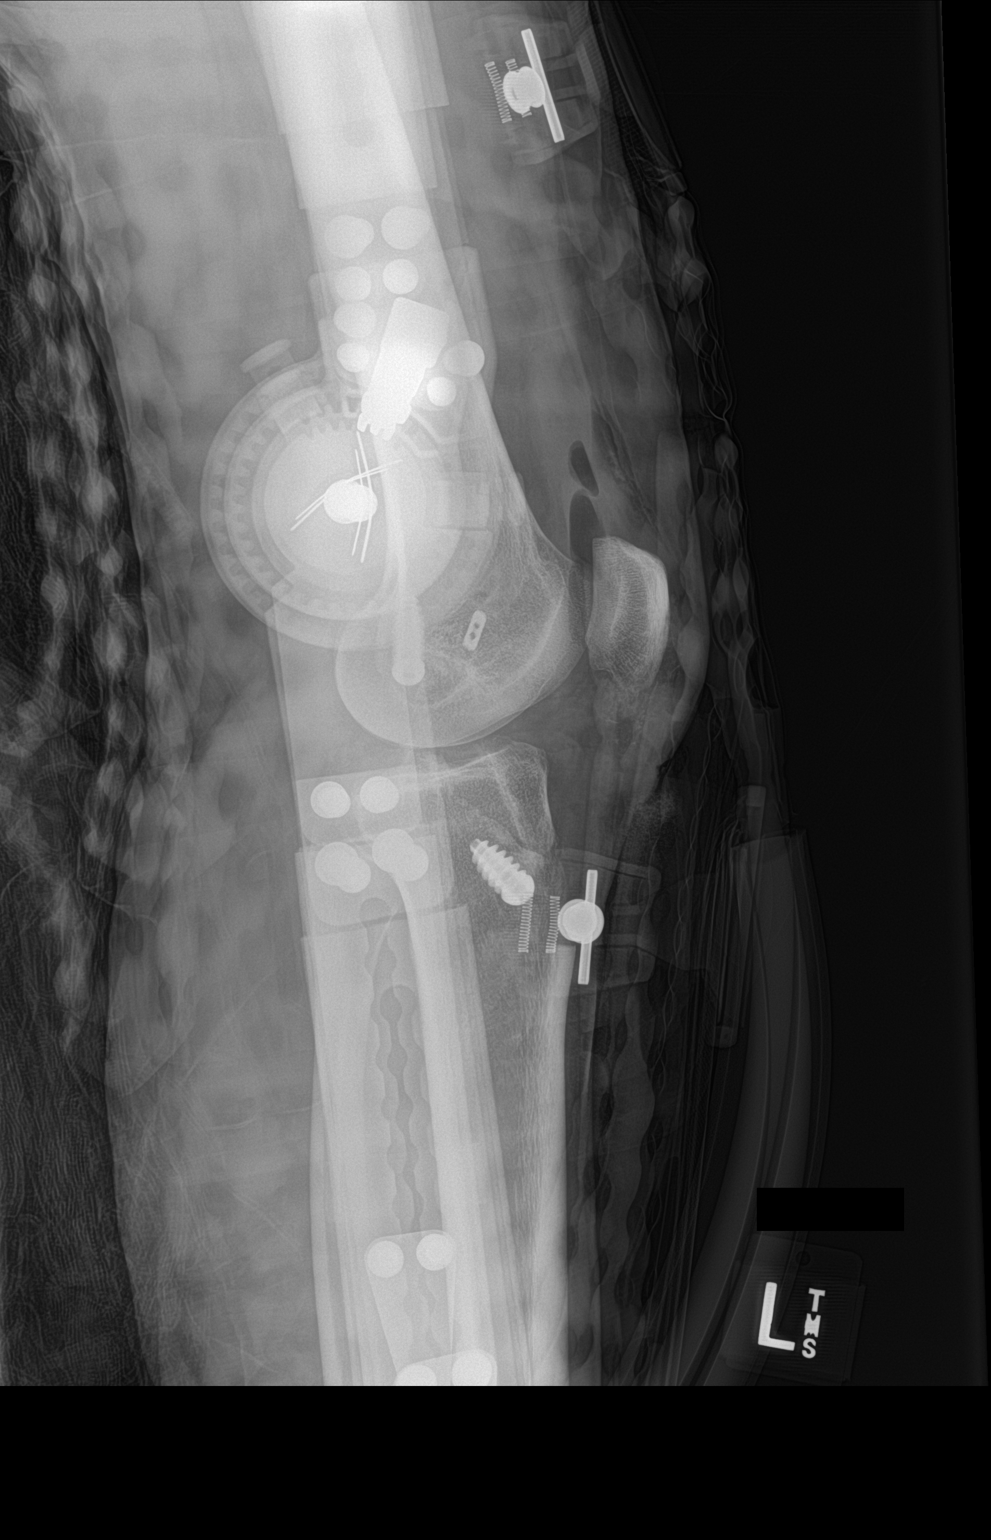

[2 of 2 positions shown; findings below may reference images not displayed]

FINDINGS: Status post ACL repair with postsurgical changes including brace
around the left knee. No acute fracture. Alignment is anatomical.
IMPRESSION: Postsurgical AP and lateral views of the ACL repair without evidence
of acute complications.

## 2022-12-17 ENCOUNTER — Other Ambulatory Visit: Payer: Self-pay

## 2022-12-17 ENCOUNTER — Encounter: Payer: Self-pay | Admitting: Emergency Medicine

## 2022-12-17 ENCOUNTER — Emergency Department
Admission: EM | Admit: 2022-12-17 | Discharge: 2022-12-17 | Disposition: A | Discharge status: Home / Self Care | Payer: Medicaid Other | Attending: Emergency Medicine | Admitting: Emergency Medicine

## 2022-12-17 DIAGNOSIS — N390 Urinary tract infection, site not specified: Secondary | ICD-10-CM | POA: Insufficient documentation

## 2022-12-17 DIAGNOSIS — R300 Dysuria: Secondary | ICD-10-CM | POA: Diagnosis present

## 2022-12-17 LAB — URINALYSIS, ROUTINE W REFLEX MICROSCOPIC
Bilirubin Urine: NEGATIVE
Glucose, UA: NEGATIVE mg/dL
Ketones, ur: NEGATIVE mg/dL
Nitrite: NEGATIVE
Protein, ur: 100 mg/dL — AB
RBC / HPF: >50 RBC/hpf (ref 0–5)
Specific Gravity, Urine: 1.031 — ABNORMAL HIGH (ref 1.005–1.030)
WBC, UA: >50 WBC/hpf (ref 0–5)
pH: 7 (ref 5.0–8.0)

## 2022-12-17 LAB — POC URINE PREG, ED: Preg Test, Ur: NEGATIVE

## 2022-12-17 MED ORDER — CEPHALEXIN 500 MG PO CAPS
500.0000 mg | ORAL_CAPSULE | Freq: Once | ORAL | Status: AC
  Administered 2022-12-17: 500 mg via ORAL
  Filled 2022-12-17: qty 1

## 2022-12-17 MED ORDER — CEPHALEXIN 500 MG PO CAPS
500.0000 mg | ORAL_CAPSULE | Freq: Three times a day (TID) | ORAL | 0 refills | Status: AC
Start: 2022-12-17 — End: 2022-12-24

## 2022-12-17 NOTE — ED Provider Notes (Signed)
[facility] Provider Note    Event Date/Time   First MD Initiated Contact with Patient 12/17/22 [phone]     (approximate)   History   Dysuria   HPI  Carmen Jackson is a 22 y.o. female with no significant past medical history who presents with complaints of dysuria and cloudy urine which she noticed yesterday.  No fevers chills nausea or vomiting.  No back pain.  No flank pain     Physical Exam   Triage Vital Signs: ED Triage Vitals  Encounter Vitals Group     BP --      Systolic BP Percentile --      Diastolic BP Percentile --      Pulse --      Resp --      Temp --      Temp src --      SpO2 --      Weight 12/17/22 0531 63.5 kg (140 lb)     Height 12/17/22 0531 1.6 m (5\' 3" )     Head Circumference --      Peak Flow --      Pain Score 12/17/22 0530 7     Pain Loc --      Pain Education --      Exclude from Growth Chart --     Most recent vital signs: Vitals:   12/17/22 0533 12/17/22 0558  BP: 118/78 125/80  Pulse: 67 (!) 56  Resp: 20 16  Temp: (!) 97.5 F (36.4 C)   SpO2: 99% 99%     General: Awake, no distress.  Pleasant, interactive CV:  Good peripheral perfusion.  Resp:  Normal effort.  Abd:  No distention.  Soft, nontender, no CVA tenderness Other:     ED Results / Procedures / Treatments   Labs (all labs ordered are listed, but only abnormal results are displayed) Labs Reviewed  URINALYSIS, ROUTINE W REFLEX MICROSCOPIC - Abnormal; Notable for the following components:      Result Value   Color, Urine YELLOW (*)    APPearance CLOUDY (*)    Specific Gravity, Urine 1.031 (*)    Hgb urine dipstick SMALL (*)    Protein, ur 100 (*)    Leukocytes,Ua MODERATE (*)    Bacteria, UA FEW (*)    All other components within normal limits  POC URINE PREG, ED     EKG     RADIOLOGY     PROCEDURES:  Critical Care performed:   Procedures   MEDICATIONS ORDERED IN ED: Medications  cephALEXin (KEFLEX) capsule 500  mg (500 mg Oral Given 12/17/22 0557)     IMPRESSION / MDM / ASSESSMENT AND PLAN / ED COURSE  I reviewed the triage vital signs and the nursing notes. Patient's presentation is most consistent with acute complicated illness / injury requiring diagnostic workup.  Patient presents with dysuria, reports of cloudy urine as described above, suspicious for urinary tract infection  Will start the patient on Keflex 3 times daily x 7 days, outpatient follow-up as needed.        FINAL CLINICAL IMPRESSION(S) / ED DIAGNOSES   Final diagnoses:  Lower urinary tract infectious disease     Rx / DC Orders   ED Discharge Orders          Ordered    cephALEXin (KEFLEX) 500 MG capsule  3 times daily        12/17/22 0551  Note:  This document was prepared using Dragon voice recognition software and may include unintentional dictation errors.   Jene Every, MD 12/18/22 [phone]

## 2022-12-17 NOTE — ED Triage Notes (Signed)
Patient ambulatory to triage with steady gait, without difficulty or distress noted; pt reports having cloudy urination and dysuria since yesterday

## 2022-12-17 NOTE — ED Notes (Signed)
Patient given discharge instructions including prescriptions x1 and importance of follow up appt with stated understanding. Patient stable and ambulatory with steady even gait on dispo.

## 2022-12-31 ENCOUNTER — Emergency Department
Admission: EM | Admit: 2022-12-31 | Discharge: 2022-12-31 | Disposition: A | Discharge status: Home / Self Care | Payer: Medicaid Other | Attending: Emergency Medicine | Admitting: Emergency Medicine

## 2022-12-31 ENCOUNTER — Other Ambulatory Visit: Payer: Self-pay

## 2022-12-31 ENCOUNTER — Emergency Department: Payer: Medicaid Other

## 2022-12-31 ENCOUNTER — Encounter: Payer: Self-pay | Admitting: Emergency Medicine

## 2022-12-31 DIAGNOSIS — N390 Urinary tract infection, site not specified: Secondary | ICD-10-CM | POA: Insufficient documentation

## 2022-12-31 DIAGNOSIS — R1030 Lower abdominal pain, unspecified: Secondary | ICD-10-CM | POA: Diagnosis present

## 2022-12-31 LAB — URINALYSIS, ROUTINE W REFLEX MICROSCOPIC
Bilirubin Urine: NEGATIVE
Glucose, UA: NEGATIVE mg/dL
Ketones, ur: 5 mg/dL — AB
Nitrite: NEGATIVE
Protein, ur: 30 mg/dL — AB
Specific Gravity, Urine: 1.031 — ABNORMAL HIGH (ref 1.005–1.030)
pH: 5 (ref 5.0–8.0)

## 2022-12-31 LAB — CBC WITH DIFFERENTIAL/PLATELET
Abs Immature Granulocytes: 0.02 10*3/uL (ref 0.00–0.07)
Basophils Absolute: 0 10*3/uL (ref 0.0–0.1)
Basophils Relative: 1 %
Eosinophils Absolute: 0.1 10*3/uL (ref 0.0–0.5)
Eosinophils Relative: 1 %
HCT: 39.2 % (ref 36.0–46.0)
Hemoglobin: 12.9 g/dL (ref 12.0–15.0)
Immature Granulocytes: 0 %
Lymphocytes Relative: 41 %
Lymphs Abs: 3.1 10*3/uL (ref 0.7–4.0)
MCH: 30 pg (ref 26.0–34.0)
MCHC: 32.9 g/dL (ref 30.0–36.0)
MCV: 91.2 fL (ref 80.0–100.0)
Monocytes Absolute: 0.6 10*3/uL (ref 0.1–1.0)
Monocytes Relative: 8 %
Neutro Abs: 3.8 10*3/uL (ref 1.7–7.7)
Neutrophils Relative %: 49 %
Platelets: 130 10*3/uL — ABNORMAL LOW (ref 150–400)
RBC: 4.3 MIL/uL (ref 3.87–5.11)
RDW: 12.1 % (ref 11.5–15.5)
WBC: 7.6 10*3/uL (ref 4.0–10.5)
nRBC: 0 % (ref 0.0–0.2)

## 2022-12-31 LAB — COMPREHENSIVE METABOLIC PANEL
ALT: 11 U/L (ref 0–44)
AST: 21 U/L (ref 15–41)
Albumin: 4.1 g/dL (ref 3.5–5.0)
Alkaline Phosphatase: 64 U/L (ref 38–126)
Anion gap: 10 (ref 5–15)
BUN: 9 mg/dL (ref 6–20)
CO2: 22 mmol/L (ref 22–32)
Calcium: 9.3 mg/dL (ref 8.9–10.3)
Chloride: 106 mmol/L (ref 98–111)
Creatinine, Ser: 0.88 mg/dL (ref 0.44–1.00)
GFR, Estimated: >60 mL/min (ref >60)
Glucose, Bld: 79 mg/dL (ref 70–99)
Potassium: 3.5 mmol/L (ref 3.5–5.1)
Sodium: 138 mmol/L (ref 135–145)
Total Bilirubin: 0.7 mg/dL (ref <1.2)
Total Protein: 7.3 g/dL (ref 6.5–8.1)

## 2022-12-31 LAB — CHLAMYDIA/NGC RT PCR (ARMC ONLY)
Chlamydia Tr: NOT DETECTED
N gonorrhoeae: NOT DETECTED

## 2022-12-31 LAB — LIPASE, BLOOD: Lipase: 27 U/L (ref 11–51)

## 2022-12-31 LAB — POC URINE PREG, ED: Preg Test, Ur: NEGATIVE

## 2022-12-31 MED ORDER — KETOROLAC TROMETHAMINE 30 MG/ML IJ SOLN
30.0000 mg | Freq: Once | INTRAMUSCULAR | Status: AC
  Administered 2022-12-31: 30 mg via INTRAVENOUS
  Filled 2022-12-31: qty 1

## 2022-12-31 MED ORDER — SODIUM CHLORIDE 0.9 % IV SOLN
1.0000 g | Freq: Once | INTRAVENOUS | Status: AC
  Administered 2022-12-31: 1 g via INTRAVENOUS
  Filled 2022-12-31: qty 10

## 2022-12-31 MED ORDER — IOHEXOL 300 MG/ML  SOLN
100.0000 mL | Freq: Once | INTRAMUSCULAR | Status: AC | PRN
  Administered 2022-12-31: 100 mL via INTRAVENOUS

## 2022-12-31 MED ORDER — DOXYCYCLINE HYCLATE 50 MG PO CAPS: 100.0000 mg | ORAL_CAPSULE | Freq: Two times a day (BID) | ORAL | 0 refills | Status: AC

## 2022-12-31 MED ORDER — DOXYCYCLINE HYCLATE 100 MG PO TABS
100.0000 mg | ORAL_TABLET | Freq: Once | ORAL | Status: AC
  Administered 2022-12-31: 100 mg via ORAL
  Filled 2022-12-31: qty 1

## 2022-12-31 MED ORDER — SODIUM CHLORIDE 0.9 % IV BOLUS
500.0000 mL | Freq: Once | INTRAVENOUS | Status: AC
  Administered 2022-12-31: 500 mL via INTRAVENOUS

## 2022-12-31 MED ORDER — ONDANSETRON HCL 4 MG/2ML IJ SOLN
4.0000 mg | Freq: Once | INTRAMUSCULAR | Status: AC
  Administered 2022-12-31: 4 mg via INTRAVENOUS
  Filled 2022-12-31: qty 2

## 2022-12-31 NOTE — ED Notes (Signed)
Reviewed D/C information with the patient, pt verbalized understanding. No additional concerns at this time.

## 2022-12-31 NOTE — ED Triage Notes (Signed)
Patient ambulatory to triage with steady gait, without difficulty or distress noted; pt reports being seen and tx recently for UTI; now with lower abd/back cramping, dysuria and vaginal irritation

## 2022-12-31 NOTE — ED Provider Notes (Signed)
[facility] Provider Note    Event Date/Time   First MD Initiated Contact with Patient 12/31/22 0150     (approximate)   History   Abdominal Pain   HPI  Carmen Jackson is a 22 y.o. female who returns to the ED from home with persistent lower abdominal and back pain, dysuria and vaginal irritation.  Patient was seen in the ED last week, diagnosed with UTI and placed on Keflex.  She has finished all but 2 pills.  Denies associated fever/chills, chest pain, shortness of breath, nausea, vomiting or dizziness.     Past Medical History   Past Medical History:  Diagnosis Date   GERD (gastroesophageal reflux disease)      Active Problem List   Patient Active Problem List   Diagnosis Date Noted   Pharyngitis 01/18/2020   Gastroesophageal reflux disease 01/18/2020     Past Surgical History   Past Surgical History:  Procedure Laterality Date   ALLOGRAFT APPLICATION Left 05/25/2020   Procedure: ALLOGRAFT APPLICATION TIBIA AND FEMUR;  Surgeon: Bjorn Pippin, MD;  Location: [facility];  Service: Orthopedics;  Laterality: Left;   ANTERIOR CRUCIATE LIGAMENT REPAIR Left 2018   ANTERIOR CRUCIATE LIGAMENT REPAIR Left 06/07/2021   Procedure: ANTERIOR CRUCIATE LIGAMENT (ACL) REPAIR;  Surgeon: Bjorn Pippin, MD;  Location: [facility];  Service: Orthopedics;  Laterality: Left;   APPENDECTOMY     FRACTURE SURGERY     KNEE ARTHROSCOPY WITH MEDIAL MENISECTOMY Left 05/25/2020   Procedure: KNEE ARTHROSCOPY WITH MEDIAL MENISECTOMY;  Surgeon: Bjorn Pippin, MD;  Location: [facility];  Service: Orthopedics;  Laterality: Left;   KNEE RECONSTRUCTION Left 06/07/2021   Procedure: KNEE RECONSTRUCTION;  Surgeon: Bjorn Pippin, MD;  Location: [facility];  Service: Orthopedics;  Laterality: Left;     Home Medications   Prior to Admission medications   Medication Sig Start Date End Date Taking? Authorizing Provider   doxycycline (VIBRAMYCIN) 50 MG capsule Take 2 capsules (100 mg total) by mouth 2 (two) times daily. 12/31/22  Yes Irean Hong, MD  diclofenac (VOLTAREN) 75 MG EC tablet Take 1 tablet (75 mg total) by mouth 2 (two) times daily. 06/07/21   McBane, Jerald Kief, PA-C  methocarbamol (ROBAXIN) 500 MG tablet Take 1 tablet (500 mg total) by mouth every 8 (eight) hours as needed for muscle spasms. 06/07/21   Vernetta Honey, PA-C     Allergies  Patient has no known allergies.   Family History   Family History  Problem Relation Age of Onset   Healthy Mother      Physical Exam  Triage Vital Signs: ED Triage Vitals  Encounter Vitals Group     BP 12/31/22 0100 108/69     Systolic BP Percentile --      Diastolic BP Percentile --      Pulse Rate 12/31/22 0100 95     Resp 12/31/22 0100 16     Temp 12/31/22 0100 98.4 F (36.9 C)     Temp Source 12/31/22 0100 Oral     SpO2 12/31/22 0100 99 %     Weight 12/31/22 0056 135 lb (61.2 kg)     Height 12/31/22 0056 5\' 3"  (1.6 m)     Head Circumference --      Peak Flow --      Pain Score 12/31/22 0056 8     Pain Loc --      Pain Education --  Exclude from Growth Chart --     Updated Vital Signs: BP 112/77   Pulse 89   Temp 98.2 F (36.8 C) (Oral)   Resp 18   Ht 5\' 3"  (1.6 m)   Wt 61.2 kg   LMP 12/31/2022 (Exact Date)   SpO2 98%   BMI 23.91 kg/m    General: Awake, no distress.  CV:  RRR.  Good peripheral perfusion.  Resp:  Normal effort.  CTAB. Abd:  Mild suprapubic tenderness to palpation without rebound or guarding.  No distention.  Other:  No truncal vesicles.   ED Results / Procedures / Treatments  Labs (all labs ordered are listed, but only abnormal results are displayed) Labs Reviewed  CBC WITH DIFFERENTIAL/PLATELET - Abnormal; Notable for the following components:      Result Value   Platelets 130 (*)    All other components within normal limits  URINALYSIS, ROUTINE W REFLEX MICROSCOPIC - Abnormal; Notable for  the following components:   Color, Urine YELLOW (*)    APPearance CLOUDY (*)    Specific Gravity, Urine 1.031 (*)    Hgb urine dipstick LARGE (*)    Ketones, ur 5 (*)    Protein, ur 30 (*)    Leukocytes,Ua MODERATE (*)    Bacteria, UA RARE (*)    All other components within normal limits  CHLAMYDIA/NGC RT PCR ([facility] ONLY)            URINE CULTURE  COMPREHENSIVE METABOLIC PANEL  LIPASE, BLOOD  POC URINE PREG, ED     EKG  None   RADIOLOGY I have independently visualized and interpreted patient's imaging study as well as noted the radiology interpretation:  CT abdomen/pelvis: Negative  Official radiology report(s): CT ABDOMEN PELVIS W CONTRAST  Result Date: 12/31/2022 CLINICAL DATA:  Lower abdominal pain, dysuria, recent UTI EXAM: CT ABDOMEN AND PELVIS WITH CONTRAST TECHNIQUE: Multidetector CT imaging of the abdomen and pelvis was performed using the standard protocol following bolus administration of intravenous contrast. RADIATION DOSE REDUCTION: This exam was performed according to the departmental dose-optimization program which includes automated exposure control, adjustment of the mA and/or kV according to patient size and/or use of iterative reconstruction technique. CONTRAST:  OMNIPAQUE IOHEXOL 300 MG/ML  SOLN COMPARISON:  None Available. FINDINGS: Lower chest: Lung bases are clear. Hepatobiliary: Liver is within normal limits. Gallbladder is unremarkable. No intrahepatic or extrahepatic duct dilatation. Pancreas: Within normal limits. Spleen: Within normal limits. Adrenals/Urinary Tract: Adrenal glands are within normal limits. Kidneys are within normal limits.  No hydronephrosis. Bladder is underdistended but unremarkable. Stomach/Bowel: Stomach is within normal limits. No evidence of bowel obstruction. Appendix is not discretely visualized. No colonic wall thickening or inflammatory changes. Vascular/Lymphatic: No evidence of abdominal aortic aneurysm. No suspicious  abdominopelvic lymphadenopathy. Reproductive: Uterus is within normal limits. No adnexal masses. Other: No abdominopelvic ascites. Musculoskeletal: Visualized osseous structures are within normal limits. IMPRESSION: Negative CT abdomen/pelvis. Electronically Signed   By: Charline Bills M.D.   On: 12/31/2022 02:48     PROCEDURES:  Critical Care performed: No  Procedures   MEDICATIONS ORDERED IN ED: Medications  ketorolac (TORADOL) 30 MG/ML injection 30 mg (30 mg Intravenous Given 12/31/22 0220)  sodium chloride 0.9 % bolus 500 mL (0 mLs Intravenous Stopped 12/31/22 0303)  ondansetron (ZOFRAN) injection 4 mg (4 mg Intravenous Given 12/31/22 0219)  cefTRIAXone (ROCEPHIN) 1 g in sodium chloride 0.9 % 100 mL IVPB (0 g Intravenous Stopped 12/31/22 0250)  doxycycline (VIBRA-TABS) tablet 100 mg (100  mg Oral Given 12/31/22 0209)  iohexol (OMNIPAQUE) 300 MG/ML solution 100 mL (100 mLs Intravenous Contrast Given 12/31/22 0233)     IMPRESSION / MDM / ASSESSMENT AND PLAN / ED COURSE  I reviewed the triage vital signs and the nursing notes.                             22 year old female returns to the ED with suprapubic abdominal pain and persistent urinary symptoms. Differential diagnosis includes, but is not limited to, ovarian cyst, ovarian torsion, acute appendicitis, diverticulitis, urinary tract infection/pyelonephritis, endometriosis, bowel obstruction, colitis, renal colic, gastroenteritis, hernia, fibroids, endometriosis, pregnancy related pain including ectopic pregnancy, etc. I have personally reviewed patient's records and note her recent ED visit from 12/17/2022.  Patient's presentation is most consistent with acute complicated illness / injury requiring diagnostic workup.  Laboratory results demonstrate normal WBC, normal renal function, moderate leukocyte positive UTI which looks about the same as prior.  Although patient does not have suspicions for STDs, she would like to be checked and  treated empirically.  Clinical Course as of 12/31/22 0512  Tue Dec 31, 2022  0251 CT negative.  Patient will self swab for STI and check her results on MyChart.  Treat empirically with doxycycline which should also suffice for UTI.  Urine culture added.  Strict return precautions given.  Patient verbalizes understanding and agrees with plan of care. [JS]  1610 Addendum on chart review: DNA negative [JS]    Clinical Course User Index [JS] Irean Hong, MD     FINAL CLINICAL IMPRESSION(S) / ED DIAGNOSES   Final diagnoses:  Lower urinary tract infectious disease     Rx / DC Orders   ED Discharge Orders          Ordered    doxycycline (VIBRAMYCIN) 50 MG capsule  2 times daily        12/31/22 0200             Note:  This document was prepared using Dragon voice recognition software and may include unintentional dictation errors.   Irean Hong, MD 12/31/22 [phone]

## 2022-12-31 NOTE — Discharge Instructions (Signed)
Your CT scan was negative for acute process in your abdomen/pelvis.  Take and finish new antibiotic as prescribed.  Urine culture is pending.  You will be notified if we need to switch your antibiotic.  You may check MyChart for the results of your STD testing.  Return to the ER for worsening symptoms, persistent vomiting, fever or other concerns.

## 2023-01-02 LAB — URINE CULTURE

## 2023-07-27 ENCOUNTER — Other Ambulatory Visit: Payer: Self-pay

## 2023-07-27 ENCOUNTER — Emergency Department
Admission: EM | Admit: 2023-07-27 | Discharge: 2023-07-28 | Disposition: A | Discharge status: Home / Self Care | Attending: Emergency Medicine | Admitting: Emergency Medicine

## 2023-07-27 DIAGNOSIS — N390 Urinary tract infection, site not specified: Secondary | ICD-10-CM | POA: Insufficient documentation

## 2023-07-27 DIAGNOSIS — R300 Dysuria: Secondary | ICD-10-CM | POA: Diagnosis present

## 2023-07-27 LAB — POC URINE PREG, ED: Preg Test, Ur: NEGATIVE

## 2023-07-27 NOTE — ED Provider Notes (Signed)
 [facility] Provider Note    Event Date/Time   First MD Initiated Contact with Patient 07/27/23 2344     (approximate)   History   Dysuria   HPI  Carmen Jackson is a 23 y.o. female with a history of GERD who presents with dysuria for the last several days, characterized by pain at the end of urination, associated with urinary frequency and lower abdominal discomfort.  The patient states it feels identical to prior UTIs.  She was just treated for UTI earlier in May with a 5-day course of nitrofurantoin.  She had almost complete relief of her symptoms and then had her menstrual period.  Subsequently, the urinary symptoms started after the end of her period.  She denies any vomiting or diarrhea.  She has no fever but has had some chills.  She denies any flank pain.  I reviewed the past medical records.  The patient's most recent prior visit was to the ED on 11/5 for lower abdominal pain and dysuria.  She was treated for UTI with doxycycline .   Physical Exam   Triage Vital Signs: ED Triage Vitals  Encounter Vitals Group     BP 07/27/23 2251 121/69     Systolic BP Percentile --      Diastolic BP Percentile --      Pulse Rate 07/27/23 2251 65     Resp 07/27/23 2251 17     Temp 07/27/23 2251 97.7 F (36.5 C)     Temp Source 07/27/23 2251 Oral     SpO2 07/27/23 2251 100 %     Weight 07/27/23 2251 131 lb 9.6 oz (59.7 kg)     Height 07/27/23 2251 5\' 3"  (1.6 m)     Head Circumference --      Peak Flow --      Pain Score 07/27/23 2307 9     Pain Loc --      Pain Education --      Exclude from Growth Chart --     Most recent vital signs: Vitals:   07/27/23 2251 07/27/23 2306  BP: 121/69   Pulse: 65   Resp: 17   Temp: 97.7 F (36.5 C)   SpO2: 100% 100%     General: Alert, well-appearing, no distress.  CV:  Good peripheral perfusion.  Resp:  Normal effort.  Abd:  Soft and nontender.  No distention.  Other:  No flank tenderness.   ED Results /  Procedures / Treatments   Labs (all labs ordered are listed, but only abnormal results are displayed) Labs Reviewed  URINALYSIS, ROUTINE W REFLEX MICROSCOPIC - Abnormal; Notable for the following components:      Result Value   Color, Urine STRAW (*)    APPearance CLEAR (*)    Hgb urine dipstick MODERATE (*)    Leukocytes,Ua MODERATE (*)    Bacteria, UA RARE (*)    All other components within normal limits  POC URINE PREG, ED     EKG    RADIOLOGY    PROCEDURES:  Critical Care performed: No  Procedures   MEDICATIONS ORDERED IN ED: Medications  cephALEXin  (KEFLEX ) capsule 500 mg (has no administration in time range)     IMPRESSION / MDM / ASSESSMENT AND PLAN / ED COURSE  I reviewed the triage vital signs and the nursing notes.  23 year old female with PMH as noted above presents with recurrent UTI symptoms.  On exam the patient is well-appearing.  Vital signs are normal.  Abdomen soft and nontender.    Differential diagnosis includes, but is not limited to, UTI/cystitis.  There is no clinical evidence for pyelonephritis.  I have a low suspicion for other acute intra-abdominal cause given the presence of the urinary symptoms.  We will obtain a urinalysis and reassess.  Patient's presentation is most consistent with acute complicated illness / injury requiring diagnostic workup.  ----------------------------------------- 12:42 AM on 07/28/2023 -----------------------------------------  Urinalysis is consistent with UTI.  The patient is stable for discharge home.  I counseled her on the results of the workup.  I have prescribed a 1 week course of Keflex .  I gave strict return precautions, and she expressed understanding.   FINAL CLINICAL IMPRESSION(S) / ED DIAGNOSES   Final diagnoses:  Lower urinary tract infectious disease     Rx / DC Orders   ED Discharge Orders          Ordered    cephALEXin  (KEFLEX ) 500 MG capsule  2 times daily        07/28/23 0041              Note:  This document was prepared using Dragon voice recognition software and may include unintentional dictation errors.    Lind Repine, MD 07/28/23 [phone]

## 2023-07-27 NOTE — ED Triage Notes (Signed)
 Pt arrived from home via POV c/o lower abdominal pain and pain with urination. Pt states that she was recently treated for UTI 05/08 with Macrobid. Pt states that it still hurts when stopping urination and feels like she just is not getting it all out. Pt also c/o head ache 9/10 x a week or 2,

## 2023-07-27 NOTE — ED Notes (Signed)
 Collected urine specimen with temporary sun quest label, sending to the lab

## 2023-07-28 LAB — URINALYSIS, ROUTINE W REFLEX MICROSCOPIC
Bilirubin Urine: NEGATIVE
Glucose, UA: NEGATIVE mg/dL
Ketones, ur: NEGATIVE mg/dL
Nitrite: NEGATIVE
Protein, ur: NEGATIVE mg/dL
Specific Gravity, Urine: 1.005 (ref 1.005–1.030)
WBC, UA: >50 WBC/hpf (ref 0–5)
pH: 7 (ref 5.0–8.0)

## 2023-07-28 MED ORDER — CEPHALEXIN 500 MG PO CAPS
500.0000 mg | ORAL_CAPSULE | Freq: Once | ORAL | Status: AC
  Administered 2023-07-28: 500 mg via ORAL
  Filled 2023-07-28: qty 1

## 2023-07-28 MED ORDER — CEPHALEXIN 500 MG PO CAPS
500.0000 mg | ORAL_CAPSULE | Freq: Two times a day (BID) | ORAL | 0 refills | Status: AC
Start: 2023-07-28 — End: 2023-08-04

## 2023-07-28 NOTE — Discharge Instructions (Signed)
 Take the antibiotic and finish the full course even if your symptoms resolve.  Return to the ER for new, worsening, or persistent severe urinary symptoms, abdominal pain, flank pain, fever, vomiting, or any other new or worsening symptoms that concern you.
